# Patient Record
Sex: Male | Born: 1945 | Race: White | Hispanic: No | Marital: Married | Smoking: Former smoker
Health system: Southern US, Community
[De-identification: ages and names within clinical notes are randomized; demographics above are authoritative.]

## PROBLEM LIST (undated history)

## (undated) DIAGNOSIS — N400 Enlarged prostate without lower urinary tract symptoms: Secondary | ICD-10-CM

## (undated) DIAGNOSIS — M1990 Unspecified osteoarthritis, unspecified site: Secondary | ICD-10-CM

## (undated) DIAGNOSIS — C4491 Basal cell carcinoma of skin, unspecified: Secondary | ICD-10-CM

## (undated) DIAGNOSIS — S82892A Other fracture of left lower leg, initial encounter for closed fracture: Secondary | ICD-10-CM

## (undated) DIAGNOSIS — E039 Hypothyroidism, unspecified: Secondary | ICD-10-CM

## (undated) DIAGNOSIS — G4730 Sleep apnea, unspecified: Secondary | ICD-10-CM

## (undated) HISTORY — PX: BASAL CELL CARCINOMA EXCISION: SHX1214

## (undated) HISTORY — PX: APPENDECTOMY: SHX54

## (undated) HISTORY — PX: ANKLE CLOSED REDUCTION: SHX880

## (undated) HISTORY — PX: COLONOSCOPY: SHX174

---

## 1996-09-26 HISTORY — PX: CARDIAC CATHETERIZATION: SHX172

## 2004-11-19 ENCOUNTER — Ambulatory Visit: Payer: Self-pay | Admitting: Internal Medicine

## 2005-01-14 ENCOUNTER — Ambulatory Visit: Payer: Self-pay | Admitting: Internal Medicine

## 2005-01-20 ENCOUNTER — Ambulatory Visit: Payer: Self-pay | Admitting: Internal Medicine

## 2005-11-21 ENCOUNTER — Ambulatory Visit: Payer: Self-pay | Admitting: Internal Medicine

## 2006-02-14 ENCOUNTER — Ambulatory Visit: Payer: Self-pay | Admitting: Internal Medicine

## 2006-02-21 ENCOUNTER — Ambulatory Visit: Payer: Self-pay | Admitting: Internal Medicine

## 2008-06-23 ENCOUNTER — Ambulatory Visit: Payer: Self-pay | Admitting: Internal Medicine

## 2008-06-23 DIAGNOSIS — H9209 Otalgia, unspecified ear: Secondary | ICD-10-CM | POA: Insufficient documentation

## 2008-07-11 ENCOUNTER — Ambulatory Visit: Payer: Self-pay | Admitting: Internal Medicine

## 2008-07-11 LAB — CONVERTED CEMR LAB
ALT: 14 units/L (ref 0–53)
AST: 17 units/L (ref 0–37)
Albumin: 3.9 g/dL (ref 3.5–5.2)
Alkaline Phosphatase: 99 units/L (ref 39–117)
BUN: 10 mg/dL (ref 6–23)
Basophils Absolute: 0 10*3/uL (ref 0.0–0.1)
Basophils Relative: 0.7 % (ref 0.0–3.0)
Bilirubin, Direct: 0.1 mg/dL (ref 0.0–0.3)
CO2: 31 meq/L (ref 19–32)
Calcium: 9.1 mg/dL (ref 8.4–10.5)
Chloride: 108 meq/L (ref 96–112)
Cholesterol: 166 mg/dL (ref 0–200)
Creatinine, Ser: 0.9 mg/dL (ref 0.4–1.5)
Eosinophils Absolute: 0.1 10*3/uL (ref 0.0–0.7)
Eosinophils Relative: 2.3 % (ref 0.0–5.0)
GFR calc Af Amer: 110 mL/min
GFR calc non Af Amer: 91 mL/min
Glucose, Bld: 95 mg/dL (ref 70–99)
HCT: 42.9 % (ref 39.0–52.0)
HDL: 29.2 mg/dL — ABNORMAL LOW (ref >39.0)
Hemoglobin: 15.2 g/dL (ref 13.0–17.0)
LDL Cholesterol: 114 mg/dL — ABNORMAL HIGH (ref 0–99)
Lymphocytes Relative: 27.2 % (ref 12.0–46.0)
MCHC: 35.3 g/dL (ref 30.0–36.0)
MCV: 87.2 fL (ref 78.0–100.0)
Monocytes Absolute: 0.4 10*3/uL (ref 0.1–1.0)
Monocytes Relative: 9.8 % (ref 3.0–12.0)
Neutro Abs: 2.8 10*3/uL (ref 1.4–7.7)
Neutrophils Relative %: 60 % (ref 43.0–77.0)
Nitrite: NEGATIVE
PSA: 2.27 ng/mL (ref 0.10–4.00)
Platelets: 149 10*3/uL — ABNORMAL LOW (ref 150–400)
Potassium: 4 meq/L (ref 3.5–5.1)
RBC: 4.92 M/uL (ref 4.22–5.81)
RDW: 13.2 % (ref 11.5–14.6)
Sodium: 144 meq/L (ref 135–145)
Specific Gravity, Urine: 1.02
TSH: 5.31 microintl units/mL (ref 0.35–5.50)
Total Bilirubin: 0.7 mg/dL (ref 0.3–1.2)
Total CHOL/HDL Ratio: 5.7
Total Protein: 7.1 g/dL (ref 6.0–8.3)
Triglycerides: 114 mg/dL (ref 0–149)
VLDL: 23 mg/dL (ref 0–40)
WBC Urine, dipstick: NEGATIVE
WBC: 4.5 10*3/uL (ref 4.5–10.5)

## 2008-07-25 ENCOUNTER — Ambulatory Visit: Payer: Self-pay | Admitting: Internal Medicine

## 2008-07-25 DIAGNOSIS — E785 Hyperlipidemia, unspecified: Secondary | ICD-10-CM

## 2009-12-11 ENCOUNTER — Ambulatory Visit: Payer: Self-pay | Admitting: Internal Medicine

## 2009-12-11 DIAGNOSIS — J019 Acute sinusitis, unspecified: Secondary | ICD-10-CM

## 2009-12-25 ENCOUNTER — Ambulatory Visit: Payer: Self-pay | Admitting: Internal Medicine

## 2009-12-25 LAB — CONVERTED CEMR LAB
Albumin: 3.9 g/dL (ref 3.5–5.2)
Alkaline Phosphatase: 111 units/L (ref 39–117)
BUN: 7 mg/dL (ref 6–23)
Basophils Absolute: 0 10*3/uL (ref 0.0–0.1)
CO2: 28 meq/L (ref 19–32)
Calcium: 8.9 mg/dL (ref 8.4–10.5)
Chloride: 105 meq/L (ref 96–112)
Creatinine, Ser: 1 mg/dL (ref 0.4–1.5)
Eosinophils Absolute: 0.1 10*3/uL (ref 0.0–0.7)
GFR calc non Af Amer: 79.88 mL/min (ref >60)
Glucose, Urine, Semiquant: NEGATIVE
Hemoglobin: 14.9 g/dL (ref 13.0–17.0)
LDL Cholesterol: 106 mg/dL — ABNORMAL HIGH (ref 0–99)
Lymphocytes Relative: 25.6 % (ref 12.0–46.0)
MCHC: 34.7 g/dL (ref 30.0–36.0)
Neutro Abs: 3.5 10*3/uL (ref 1.4–7.7)
Nitrite: NEGATIVE
Platelets: 187 10*3/uL (ref 150.0–400.0)
Protein, U semiquant: NEGATIVE
RDW: 13.9 % (ref 11.5–14.6)
Sodium: 138 meq/L (ref 135–145)
TSH: 6.94 microintl units/mL — ABNORMAL HIGH (ref 0.35–5.50)
Total Bilirubin: 0.5 mg/dL (ref 0.3–1.2)
Urobilinogen, UA: 0.2
VLDL: 34 mg/dL (ref 0.0–40.0)

## 2010-01-04 ENCOUNTER — Ambulatory Visit: Payer: Self-pay | Admitting: Internal Medicine

## 2010-07-07 ENCOUNTER — Ambulatory Visit: Payer: Self-pay | Admitting: Internal Medicine

## 2010-07-07 DIAGNOSIS — M79609 Pain in unspecified limb: Secondary | ICD-10-CM | POA: Insufficient documentation

## 2010-07-09 ENCOUNTER — Ambulatory Visit: Payer: Self-pay | Admitting: Internal Medicine

## 2010-07-12 ENCOUNTER — Ambulatory Visit: Payer: Self-pay | Admitting: Internal Medicine

## 2010-07-14 ENCOUNTER — Encounter: Payer: Self-pay | Admitting: Internal Medicine

## 2010-07-14 ENCOUNTER — Telehealth: Payer: Self-pay | Admitting: Internal Medicine

## 2010-07-19 ENCOUNTER — Telehealth: Payer: Self-pay | Admitting: Internal Medicine

## 2010-10-26 NOTE — Assessment & Plan Note (Signed)
Summary: cpx/njr   Vital Signs:  Patient profile:   65 year old male Height:      68 inches Weight:      207 pounds BMI:     31.59 Temp:     98.2 degrees F oral BP sitting:   138 / 80  (left arm) Cuff size:   regular  Vitals Entered By: Duard Brady LPN (January 04, 2010 2:37 PM) CC: cpx - doing well - labs done Is Patient Diabetic? No   CC:  cpx - doing well - labs done.  History of Present Illness: a 65 year old patient who is seen today for a health maintenance examination.  He has a history of mild dyslipidemia with a low HDL cholesterol.  He has done quite well without any other concerns or complaints.  He was seen approximately 1 month ago for a sinus condition which has resolved.  Preventive Screening-Counseling & Management  Alcohol-Tobacco     Smoking Status: never  Allergies: 1)  ! Sulf-10  Past History:  Past Medical History: Reviewed history from 07/25/2008 and no changes required. Hyperlipidemia- low HDL cholesterol  Past Surgical History: Reviewed history from 07/25/2008 and no changes required. Appendectomy  age 46 heart catheterization, 1998 colonoscopy January 2007  Family History: Reviewed history from 06/23/2008 and no changes required. father died age 42, senile dementia mother died age 25.  GI malignancy, unclear type  History of uncles with prostate and bladder cancer  Two brothers, one with history of prostate cancer, age 18 one sister in good health  Social History: Reviewed history from 07/25/2008 and no changes required. Married Smoking Status:  never  Review of Systems  The patient denies anorexia, fever, weight loss, weight gain, vision loss, decreased hearing, hoarseness, chest pain, syncope, dyspnea on exertion, peripheral edema, prolonged cough, headaches, hemoptysis, abdominal pain, melena, hematochezia, severe indigestion/heartburn, hematuria, incontinence, genital sores, muscle weakness, suspicious skin lesions,  transient blindness, difficulty walking, depression, unusual weight change, abnormal bleeding, enlarged lymph nodes, angioedema, breast masses, and testicular masses.    Physical Exam  General:  mildly overweight.  Blood pressure 130/80 Head:  Normocephalic and atraumatic without obvious abnormalities. No apparent alopecia or balding. Eyes:  No corneal or conjunctival inflammation noted. EOMI. Perrla. Funduscopic exam benign, without hemorrhages, exudates or papilledema. Vision grossly normal. Ears:  External ear exam shows no significant lesions or deformities.  Otoscopic examination reveals clear canals, tympanic membranes are intact bilaterally without bulging, retraction, inflammation or discharge. Hearing is grossly normal bilaterally. Nose:  External nasal examination shows no deformity or inflammation. Nasal mucosa are pink and moist without lesions or exudates. Mouth:  Oral mucosa and oropharynx without lesions or exudates.  Teeth in good repair. Neck:  No deformities, masses, or tenderness noted. Chest Wall:  No deformities, masses, tenderness or gynecomastia noted. Breasts:  No masses or gynecomastia noted Lungs:  Normal respiratory effort, chest expands symmetrically. Lungs are clear to auscultation, no crackles or wheezes. Heart:  Normal rate and regular rhythm. S1 and S2 normal without gallop, murmur, click, rub or other extra sounds. Abdomen:  Bowel sounds positive,abdomen soft and non-tender without masses, organomegaly or hernias noted. Rectal:  No external abnormalities noted. Normal sphincter tone. No rectal masses or tenderness. Genitalia:  Testes bilaterally descended without nodularity, tenderness or masses. No scrotal masses or lesions. No penis lesions or urethral discharge. Prostate:  1+ enlarged.  1+ enlarged.   Msk:  No deformity or scoliosis noted of thoracic or lumbar spine.   Pulses:  R  and L carotid,radial,femoral,dorsalis pedis and posterior tibial pulses are full  and equal bilaterally Extremities:  No clubbing, cyanosis, edema, or deformity noted with normal full range of motion of all joints.   Neurologic:  No cranial nerve deficits noted. Station and gait are normal. Plantar reflexes are down-going bilaterally. DTRs are symmetrical throughout. Sensory, motor and coordinative functions appear intact. Skin:  Intact without suspicious lesions or rashes Cervical Nodes:  No lymphadenopathy noted Axillary Nodes:  No palpable lymphadenopathy Inguinal Nodes:  No significant adenopathy Psych:  Cognition and judgment appear intact. Alert and cooperative with normal attention span and concentration. No apparent delusions, illusions, hallucinations   Impression & Recommendations:  Problem # 1:  PREVENTIVE HEALTH CARE (ICD-V70.0)  Orders: EKG w/ Interpretation (93000)  Complete Medication List: 1)  Niaspan 1000 Mg Cr-tabs (Niacin (antihyperlipidemic)) .... One at bedtime  Patient Instructions: 1)  Please schedule a follow-up appointment in 1 year. 2)  It is important that you exercise regularly at least 20 minutes 5 times a week. If you develop chest pain, have severe difficulty breathing, or feel very tired , stop exercising immediately and seek medical attention. 3)  You need to lose weight. Consider a lower calorie diet and regular exercise.  4)  consider niacin to titrate to 2000 mg daily if tolerated

## 2010-10-26 NOTE — Progress Notes (Signed)
Summary: please advise of shingles  Phone Note Call from Patient Call back at [phone]   Caller: Patient---voice mail Summary of Call: Has shingles. was seen last Wednesday and was given the vaccination. Please return call Initial call taken by: Warnell Forester,  July 14, 2010 9:12 AM  Follow-up for Phone Call        Dr. Amador Cunas returned call , [facility]   KIK Follow-up by: Duard Brady LPN,  July 14, 2010 1:06 PM    New/Updated Medications: VALACYCLOVIR HCL 1 GM TABS (VALACYCLOVIR HCL) one  three times a day Prescriptions: VALACYCLOVIR HCL 1 GM TABS (VALACYCLOVIR HCL) one  three times a day  #21 x 0   Entered and Authorized by:   Gordy Savers  MD   Signed by:   Gordy Savers  MD on 07/14/2010   Method used:   [facility]        [facility] [address]       [address]       [place], [place]  [address]       Ph: [phone] or [phone]       Fax: [phone]   RxID:   [id number]

## 2010-10-26 NOTE — Assessment & Plan Note (Signed)
Summary: sinuses//ccm   Vital Signs:  Patient profile:   65 year old male Weight:      206 pounds Temp:     98.3 degrees F oral BP sitting:   120 / 74  (left arm) Cuff size:   regular  Vitals Entered By: Duard Brady LPN (December 11, 2009 1:40 PM) CC: c/o congestion - had amox refill and started that on sunday for what he says was a sinus infection with fever Is Patient Diabetic? No   CC:  c/o congestion - had amox refill and started that on sunday for what he says was a sinus infection with fever.  History of Present Illness: a 65 year old gentleman, who is seen today complaining of left maxillary sinus discomfort.  He had an old refill on amoxicillin that he has been for the past 5 days with improvement.He has a history of dyslipidemia low HDL.  He is off niacin therapy.  Otherwise, he has done well.  Preventive Screening-Counseling & Management  Alcohol-Tobacco     Smoking Status: quit  Allergies: 1)  ! Sulf-10  Past History:  Past Medical History: Reviewed history from 07/25/2008 and no changes required. Hyperlipidemia- low HDL cholesterol  Review of Systems       The patient complains of headaches.  The patient denies anorexia, fever, weight loss, weight gain, vision loss, decreased hearing, hoarseness, chest pain, syncope, dyspnea on exertion, peripheral edema, prolonged cough, hemoptysis, abdominal pain, melena, hematochezia, severe indigestion/heartburn, hematuria, incontinence, genital sores, muscle weakness, suspicious skin lesions, transient blindness, difficulty walking, depression, unusual weight change, abnormal bleeding, enlarged lymph nodes, angioedema, breast masses, and testicular masses.    Physical Exam  General:  Well-developed,well-nourished,in no acute distress; alert,appropriate and cooperative throughout examination Head:  Normocephalic and atraumatic without obvious abnormalities. No apparent alopecia or balding. mild discomfort in the left  maxillary sinus region Eyes:  No corneal or conjunctival inflammation noted. EOMI. Perrla. Funduscopic exam benign, without hemorrhages, exudates or papilledema. Vision grossly normal. Ears:  External ear exam shows no significant lesions or deformities.  Otoscopic examination reveals clear canals, tympanic membranes are intact bilaterally without bulging, retraction, inflammation or discharge. Hearing is grossly normal bilaterally. Nose:  External nasal examination shows no deformity or inflammation. Nasal mucosa are pink and moist without lesions or exudates. Mouth:  Oral mucosa and oropharynx without lesions or exudates.  Teeth in good repair. Neck:  No deformities, masses, or tenderness noted. Breasts:  No masses or gynecomastia noted Lungs:  Normal respiratory effort, chest expands symmetrically. Lungs are clear to auscultation, no crackles or wheezes. Heart:  Normal rate and regular rhythm. S1 and S2 normal without gallop, murmur, click, rub or other extra sounds. Abdomen:  Bowel sounds positive,abdomen soft and non-tender without masses, organomegaly or hernias noted. Msk:  No deformity or scoliosis noted of thoracic or lumbar spine.   Pulses:  R and L carotid,radial,femoral,dorsalis pedis and posterior tibial pulses are full and equal bilaterally   Impression & Recommendations:  Problem # 1:  SINUSITIS- ACUTE-NOS (ICD-461.9)  The following medications were removed from the medication list:    Amoxicillin 500 Mg Caps (Amoxicillin) ..... One tablet 3 times daily His updated medication list for this problem includes:    Amoxicillin-pot Clavulanate 875-125 Mg Tabs (Amoxicillin-pot clavulanate) ..... One twice daily  The following medications were removed from the medication list:    Amoxicillin 500 Mg Caps (Amoxicillin) ..... One tablet 3 times daily His updated medication list for this problem includes:    Amoxicillin-pot Clavulanate  875-125 Mg Tabs (Amoxicillin-pot clavulanate) .....  One twice daily  Problem # 2:  HYPERLIPIDEMIA (ICD-272.4)  His updated medication list for this problem includes:    Niaspan 1000 Mg Cr-tabs (Niacin (antihyperlipidemic)) ..... One at bedtime  His updated medication list for this problem includes:    Niaspan 1000 Mg Cr-tabs (Niacin (antihyperlipidemic)) ..... One at bedtime  Complete Medication List: 1)  Niaspan 1000 Mg Cr-tabs (Niacin (antihyperlipidemic)) .... One at bedtime 2)  Amoxicillin-pot Clavulanate 875-125 Mg Tabs (Amoxicillin-pot clavulanate) .... One twice daily  Patient Instructions: 1)  Please schedule a follow-up appointment in 6 months. 2)  It is important that you exercise regularly at least 20 minutes 5 times a week. If you develop chest pain, have severe difficulty breathing, or feel very tired , stop exercising immediately and seek medical attention. 3)  You need to lose weight. Consider a lower calorie diet and regular exercise.  Prescriptions: AMOXICILLIN-POT CLAVULANATE 875-125 MG TABS (AMOXICILLIN-POT CLAVULANATE) one twice daily  #20 x 0   Entered and Authorized by:   Gordy Savers  MD   Signed by:   Gordy Savers  MD on 12/11/2009   Method used:   Print then Give to Patient   RxID:   [id number]

## 2010-10-26 NOTE — Assessment & Plan Note (Signed)
Summary: left leg pain/njr   Vital Signs:  Patient profile:   65 year old male Weight:      211 pounds Temp:     97.6 degrees F oral BP sitting:   130 / 80  (left arm) Cuff size:   regular  Vitals Entered By: Duard Brady LPN (July 07, 2010 10:13 AM) CC: c/o (L) leg pain , no fall no injury - had hip pain this wkend Is Patient Diabetic? No   CC:  c/o (L) leg pain  and no fall no injury - had hip pain this wkend.  History of Present Illness: a 65 year old patient who presents with left leg pain for the past few days.  He did this considerable activities over the weekend, including climbing ladders.  He has pain in the left buttock anterior thigh and calf region.  He also has some discomfort in the left hip region.  He is somewhat concerned about a DVT due to a  brother with a history of a pulmonary emboli.  he has a history of dyslipidemia, treated with niacin therapy  Allergies: 1)  ! Sulf-10  Past History:  Past Medical History: Reviewed history from 07/25/2008 and no changes required. Hyperlipidemia- low HDL cholesterol  Review of Systems  The patient denies anorexia, fever, weight loss, weight gain, vision loss, decreased hearing, hoarseness, chest pain, syncope, dyspnea on exertion, peripheral edema, prolonged cough, headaches, hemoptysis, abdominal pain, hematochezia, severe indigestion/heartburn, hematuria, incontinence, genital sores, muscle weakness, suspicious skin lesions, transient blindness, difficulty walking, depression, unusual weight change, abnormal bleeding, enlarged lymph nodes, angioedema, breast masses, and testicular masses.    Physical Exam  General:  Well-developed,well-nourished,in no acute distress; alert,appropriate and cooperative throughout examination Msk:  full range and of motion of the hip; slight left calf tenderness, but no swelling, slight tenderness over the anterior thigh   Impression & Recommendations:  Problem # 1:  LEG  PAIN, LEFT (ICD-729.5)  Orders: Venipuncture (91478) T-D-Dimer Fibrin Derivatives Quantitive (29562-13086) Specimen Handling (57846)  Problem # 2:  HYPERLIPIDEMIA (ICD-272.4)  His updated medication list for this problem includes:    Niaspan 1000 Mg Cr-tabs (Niacin (antihyperlipidemic)) ..... One at bedtime  Complete Medication List: 1)  Niaspan 1000 Mg Cr-tabs (Niacin (antihyperlipidemic)) .... One at bedtime  Other Orders: Zoster (Shingles) Vaccine Live [phone]) Admin 1st Vaccine (28413)  Patient Instructions: 1)  Please schedule a follow-up appointment as needed. 2)  Take 400-600mg  of Ibuprofen (Advil, Motrin) with food every 4-6 hours as needed for relief of pain or comfort of fever.   Immunization History:  Influenza Immunization History:    Influenza:  Historical (06/10/2010)  Zostavax History:    Zostavax # 1:  Zostavax (07/07/2010)  Immunizations Administered:  Zostavax # 1:    Vaccine Type: Zostavax    Site: right deltoid    Mfr: Merck    Dose: 0.5 ml    Route: [place]    Given by: Duard Brady LPN    Exp. Date: 05/14/2011    Lot #: [id number]    VIS given: 07/08/05 given July 07, 2010.    Physician counseled: yes recvd at work per pt   KIK

## 2010-10-26 NOTE — Progress Notes (Signed)
Summary: refill shingles med   Phone Note Call from Patient Call back at [phone]   Caller: vm Summary of Call: Antiviral drug Wed.  Does he need to see me & should it be refilled?   Antiviral drug has helped.  Shingle sores still present, some are healing, one base of spine still irritated.  Should antiviral be continued after Wed.   What is your plan of care?  Should I come back in?   Am I progressing satisfactorily?  Appointment declined.  [facility]       Initial call taken by: Rudy Jew, RN,  July 19, 2010 9:52 AM  Follow-up for Phone Call        no benefit to taking additional antiviral therapy for shingles Follow-up by: Gordy Savers  MD,  July 19, 2010 11:15 AM  Additional Follow-up for Phone Call Additional follow up Details #1::        Phone Call Completed Additional Follow-up by: Rudy Jew, RN,  July 19, 2010 11:24 AM

## 2010-10-26 NOTE — Assessment & Plan Note (Signed)
Summary: fup on sciatic nerve/cjr   Vital Signs:  Patient profile:   65 year old male Temp:     99.1 degrees F oral BP sitting:   130 / 80  (right arm) Cuff size:   regular  Vitals Entered By: Duard Brady LPN (July 12, 2010 1:22 PM) vCC: still with c/o (L) low back pain, "topical"pain to thigh area , not sleeping Is Patient Diabetic? No   CC:  still with c/o (L) low back pain, "topical"pain to thigh area , and not sleeping.  History of Present Illness: 8 -year-old patient who is seen today for follow-up.  He does have some mild low back pain, which is associated with the discomfort in the left thigh region.  There has been no motor weakness; he has had  some difficulty with sleep due to the heightened pain.  Sensation, most marked in the left medial thigh.  Allergies: 1)  ! Sulf-10  Past History:  Past Medical History: Reviewed history from 07/25/2008 and no changes required. Hyperlipidemia- low HDL cholesterol  Physical Exam  General:  Well-developed,well-nourished,in no acute distress; alert,appropriate and cooperative throughout examination Msk:  negative straight leg test range of motion of the hip intact motor strength normal able walk on toes and heels that difficulty patellar and Achilles reflexes intact   Impression & Recommendations:  Problem # 1:  LEG PAIN, LEFT (ICD-729.5) patient probably has a fairly mild radiculopathy.  He will continue on rest, anti-inflammatories.  He will call if unimproved and an MRI will be considered.  If there is clinical worsening intractable pain or if he develops any motor weakness  Complete Medication List: 1)  Niaspan 1000 Mg Cr-tabs (Niacin (antihyperlipidemic)) .... One at bedtime  Patient Instructions: 1)  Take 400-600mg  of Ibuprofen (Advil, Motrin) with food every 4-6 hours as needed for relief of pain or comfort of fever. 2)  call if any clinical worsening   Orders Added: 1)  Est. Patient Level III  [91478]

## 2010-10-26 NOTE — Miscellaneous (Signed)
Summary: Waiver of Liability for Zostavax  Waiver of Liability for Zostavax   Imported By: Maryln Gottron 07/08/2010 13:37:27  _____________________________________________________________________  External Attachment:    Type:   Image     Comment:   External Document

## 2010-10-26 NOTE — Assessment & Plan Note (Signed)
Summary: L HIP PAIN // RS   Vital Signs:  Patient profile:   65 year old male Weight:      211 pounds Temp:     98.5 degrees F oral BP sitting:   130 / 70  (right arm) Cuff size:   regular  Vitals Entered By: Duard Brady LPN (July 09, 2010 9:03 AM) CC: pain moving - (R) mid back , calf , knee    pain med not really helping Is Patient Diabetic? No   CC:  pain moving - (R) mid back , calf , and knee    pain med not really helping.  History of Present Illness: a 65 year old patient has had discomfort in the left lower back left buttock and left leg intermittently since heavy manual activity is over last weekend.  He was seen here two days ago concerned about a possible DVT since a brother had a pulmonary embolism.  A d-dimer was negative and his clinical exam was unremarkable.  He continues to have mild intermittent discomfort that waxed and wanes in the left lower back, buttock and thigh region.  He is using Aleve, but at a dose of only one twice daily  Allergies: 1)  ! Sulf-10  Past History:  Past Medical History: Reviewed history from 07/25/2008 and no changes required. Hyperlipidemia- low HDL cholesterol  Physical Exam  General:  Well-developed,well-nourished,in no acute distress; alert,appropriate and cooperative throughout examination Msk:  negative straight leg test full range of motion left hip no swelling or signs of active inflammation no tenderness in the left lumbar spine region or evidence of muscle spasm   Impression & Recommendations:  Problem # 1:  LEG PAIN, LEFT (ICD-729.5)  Complete Medication List: 1)  Niaspan 1000 Mg Cr-tabs (Niacin (antihyperlipidemic)) .... One at bedtime  Patient Instructions: 1)  Take 650-1000mg  of Tylenol every 4-6 hours as needed for relief of pain or comfort of fever AVOID taking more than 4000mg   in a 24 hour period (can cause liver damage in higher doses). 2)  Take 400-600mg  of Ibuprofen (Advil, Motrin) with food  every 4-6 hours as needed for relief of pain or comfort of fever.

## 2011-01-31 ENCOUNTER — Encounter: Payer: Self-pay | Admitting: Internal Medicine

## 2011-04-05 ENCOUNTER — Telehealth: Payer: Self-pay

## 2011-04-05 NOTE — Telephone Encounter (Signed)
Called pt to discuss labs recv'd from [facility] - ordered by Dr. Kallie Locks 04/04/11 - Mr.Mcvey states Dr. Kallie Locks is his PCP now - not Dr. Amador Cunas and that he will f/u on labs with him.  Dr. Amador Cunas made aware.  To suandrea to remove dr. Amador Cunas as pcp.

## 2013-09-26 HISTORY — PX: SHOULDER OPEN ROTATOR CUFF REPAIR: SHX2407

## 2015-06-17 ENCOUNTER — Inpatient Hospital Stay (HOSPITAL_COMMUNITY)
Admission: AD | Admit: 2015-06-17 | Discharge: 2015-06-19 | DRG: 493 | Disposition: A | Discharge status: Home Health | Payer: Medicare Other | Source: Ambulatory Visit | Attending: Orthopedic Surgery | Admitting: Orthopedic Surgery

## 2015-06-17 ENCOUNTER — Encounter (HOSPITAL_COMMUNITY): Payer: Self-pay | Admitting: Orthopedic Surgery

## 2015-06-17 ENCOUNTER — Inpatient Hospital Stay (HOSPITAL_COMMUNITY): Payer: Medicare Other

## 2015-06-17 DIAGNOSIS — Z87891 Personal history of nicotine dependence: Secondary | ICD-10-CM | POA: Diagnosis not present

## 2015-06-17 DIAGNOSIS — E039 Hypothyroidism, unspecified: Secondary | ICD-10-CM | POA: Diagnosis present

## 2015-06-17 DIAGNOSIS — S82899A Other fracture of unspecified lower leg, initial encounter for closed fracture: Secondary | ICD-10-CM

## 2015-06-17 DIAGNOSIS — S82852A Displaced trimalleolar fracture of left lower leg, initial encounter for closed fracture: Secondary | ICD-10-CM | POA: Diagnosis present

## 2015-06-17 DIAGNOSIS — L03116 Cellulitis of left lower limb: Secondary | ICD-10-CM | POA: Diagnosis present

## 2015-06-17 DIAGNOSIS — S82892A Other fracture of left lower leg, initial encounter for closed fracture: Secondary | ICD-10-CM

## 2015-06-17 DIAGNOSIS — S9305XA Dislocation of left ankle joint, initial encounter: Principal | ICD-10-CM | POA: Diagnosis present

## 2015-06-17 DIAGNOSIS — T148XXA Other injury of unspecified body region, initial encounter: Secondary | ICD-10-CM

## 2015-06-17 DIAGNOSIS — Z882 Allergy status to sulfonamides status: Secondary | ICD-10-CM

## 2015-06-17 DIAGNOSIS — Z01811 Encounter for preprocedural respiratory examination: Secondary | ICD-10-CM

## 2015-06-17 HISTORY — DX: Basal cell carcinoma of skin, unspecified: C44.91

## 2015-06-17 HISTORY — DX: Other fracture of left lower leg, initial encounter for closed fracture: S82.892A

## 2015-06-17 HISTORY — DX: Sleep apnea, unspecified: G47.30

## 2015-06-17 HISTORY — DX: Hypothyroidism, unspecified: E03.9

## 2015-06-17 HISTORY — DX: Benign prostatic hyperplasia without lower urinary tract symptoms: N40.0

## 2015-06-17 MED ORDER — SENNOSIDES-DOCUSATE SODIUM 8.6-50 MG PO TABS: 1.0000 | ORAL_TABLET | Freq: Every evening | ORAL | Status: DC | PRN

## 2015-06-17 MED ORDER — LACTATED RINGERS IV SOLN
INTRAVENOUS | Status: DC
  Administered 2015-06-17: 18:00:00 via INTRAVENOUS

## 2015-06-17 MED ORDER — INFLUENZA VAC SPLIT QUAD 0.5 ML IM SUSY
0.5000 mL | PREFILLED_SYRINGE | INTRAMUSCULAR | Status: AC
  Administered 2015-06-18: 0.5 mL via INTRAMUSCULAR
  Filled 2015-06-17: qty 0.5

## 2015-06-17 MED ORDER — CEFAZOLIN SODIUM 1-5 GM-% IV SOLN
1.0000 g | Freq: Three times a day (TID) | INTRAVENOUS | Status: DC
  Administered 2015-06-17 – 2015-06-19 (×6): 1 g via INTRAVENOUS
  Filled 2015-06-17 (×11): qty 50

## 2015-06-17 MED ORDER — HYDROCODONE-ACETAMINOPHEN 5-325 MG PO TABS
1.0000 | ORAL_TABLET | Freq: Four times a day (QID) | ORAL | Status: DC | PRN
  Administered 2015-06-17 – 2015-06-18 (×3): 2 via ORAL
  Filled 2015-06-17 (×3): qty 2

## 2015-06-17 MED ORDER — PROMETHAZINE HCL 25 MG PO TABS: 12.5000 mg | ORAL_TABLET | Freq: Four times a day (QID) | ORAL | Status: DC | PRN

## 2015-06-17 MED ORDER — ALUM & MAG HYDROXIDE-SIMETH 200-200-20 MG/5ML PO SUSP: 30.0000 mL | Freq: Four times a day (QID) | ORAL | Status: DC | PRN

## 2015-06-17 MED ORDER — DOCUSATE SODIUM 100 MG PO CAPS
100.0000 mg | ORAL_CAPSULE | Freq: Two times a day (BID) | ORAL | Status: DC
  Administered 2015-06-17 – 2015-06-18 (×2): 100 mg via ORAL
  Filled 2015-06-17 (×2): qty 1

## 2015-06-17 MED ORDER — OXYCODONE HCL 5 MG PO TABS: 5.0000 mg | ORAL_TABLET | ORAL | Status: DC | PRN

## 2015-06-17 MED ORDER — HYDROMORPHONE HCL 1 MG/ML IJ SOLN: 0.5000 mg | INTRAMUSCULAR | Status: DC | PRN

## 2015-06-17 MED ORDER — ACETAMINOPHEN 650 MG RE SUPP: 650.0000 mg | Freq: Four times a day (QID) | RECTAL | Status: DC | PRN

## 2015-06-17 MED ORDER — SORBITOL 70 % SOLN: 30.0000 mL | Freq: Every day | Status: DC | PRN

## 2015-06-17 MED ORDER — MAGNESIUM CITRATE PO SOLN: 1.0000 | Freq: Once | ORAL | Status: DC | PRN

## 2015-06-17 MED ORDER — ACETAMINOPHEN 325 MG PO TABS: 650.0000 mg | ORAL_TABLET | Freq: Four times a day (QID) | ORAL | Status: DC | PRN

## 2015-06-17 MED ORDER — METHOCARBAMOL 500 MG PO TABS: 500.0000 mg | ORAL_TABLET | Freq: Four times a day (QID) | ORAL | Status: DC | PRN

## 2015-06-17 NOTE — H&P (Signed)
[facility] H&P   Chief Complaint:  Left ankle fracture dislocation  HPI:   69 y/o male sustained a fracture-dislocation to his left ankle on 06/12/2015 [place] the [place]. Seen in ED and had reduction performed. Pt followed up with Dr. French Ana here in town on 9/19 and ankle was subluxed still. Pt was also noted to have extensive blistering to his ankle as well as some erythema. Pt had a new splint applied with re-reduction of his fracture. Due to the complexity of the injury he was referred to OTS for definitive management. Pt see in office on 9/21. Follow up xrays showed persistent lateral subluxation of his talus. His splint was split to eval soft tissues. He has quite extensive erythema and blistering, primarily laterally from what was seen.  Based on this we felt it necessary to admit the pt for IV abx and placement of ex fix in the morning   Past Medical History  Diagnosis Date  . 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 BPH (benign prostatic hyperplasia)     Past Surgical History  Procedure Laterality Date  . Rotator cuff repair Right 2015  . Appendectomy  1960s    No family history on file. Social History:  reports that he has quit smoking. He does not have any smokeless tobacco history on file. His alcohol and drug histories are not on file.  Allergies:  Allergies  Allergen Reactions  . Sulfacetamide Sodium     meds PTA  Keflex  Percocet  Levothyroxine   Labs pending   Review of Systems  Constitutional: Negative for fever and chills.  Respiratory: Negative for shortness of breath and wheezing.   Cardiovascular: Negative for chest pain and palpitations.  Gastrointestinal: Negative for nausea, vomiting and abdominal pain.  Musculoskeletal:       Left ankle pain   Neurological: Negative for tingling, sensory change and headaches.    Vitals on arrival to floor  Physical Exam  Constitutional: He is oriented to person, place, and  time. He appears well-developed. He is cooperative. He does not have a sickly appearance. He does not appear ill. No distress.  Cardiovascular: Normal rate, regular rhythm, S1 normal and S2 normal.   Respiratory: Effort normal and breath sounds normal. No accessory muscle usage. No respiratory distress. He has no decreased breath sounds. He has no wheezes. He has no rhonchi.  GI: Soft. Bowel sounds are normal. There is no tenderness.  Musculoskeletal:  Left Lower Extremity    Pt splinted   Partial split of the splint shows marked erythema around ankle and mid lower leg   Extensive fracture blistering noted as well    Ext warm    + DP pulse    DPN, SPN, TN sensation intact    EHL, FHL, lesser toe motor functions intact    Knee nontender to evaluation    No pain with passive stretching   Neurological: He is alert and oriented to person, place, and time.  Psychiatric: He has a normal mood and affect. His speech is normal and behavior is normal.     Xrays    L ankle (3 view0  Displaced medial mall fracture             Oblique fracture distal fibula  Lateral subluxation of talus              Syndesmosis appears intact   Assessment/Plan  69 y/o male with bimall ankle fracture dislocation  Admit today  to start IV abx OR tomorrow for application of spanning ex fix to address fracture and to allow for soft tissue management NWB L leg Hopeful that ex fix will allow for rapid resolution of swelling to proceed with ORIF of ankle in 10-14 days if needed Check xray of L knee NPO after MN   Jari Pigg, PA-C [facility] [phone] (P) 06/17/2015, 10:49 AM

## 2015-06-18 ENCOUNTER — Inpatient Hospital Stay (HOSPITAL_COMMUNITY): Payer: Medicare Other

## 2015-06-18 ENCOUNTER — Encounter (HOSPITAL_COMMUNITY)
Admission: AD | Disposition: A | Discharge status: Home Health | Payer: Self-pay | Source: Ambulatory Visit | Attending: Orthopedic Surgery

## 2015-06-18 ENCOUNTER — Inpatient Hospital Stay (HOSPITAL_COMMUNITY): Payer: Medicare Other | Admitting: Anesthesiology

## 2015-06-18 ENCOUNTER — Other Ambulatory Visit: Payer: Self-pay

## 2015-06-18 ENCOUNTER — Inpatient Hospital Stay (HOSPITAL_COMMUNITY): Admission: RE | Admit: 2015-06-18 | Payer: Medicare Other | Source: Ambulatory Visit | Admitting: Orthopedic Surgery

## 2015-06-18 DIAGNOSIS — S9305XA Dislocation of left ankle joint, initial encounter: Secondary | ICD-10-CM | POA: Diagnosis not present

## 2015-06-18 HISTORY — PX: ORIF ANKLE FRACTURE: SHX5408

## 2015-06-18 LAB — COMPREHENSIVE METABOLIC PANEL
ALBUMIN: 3.2 g/dL — AB (ref 3.5–5.0)
ALT: 19 U/L (ref 17–63)
ANION GAP: 7 (ref 5–15)
AST: 25 U/L (ref 15–41)
Alkaline Phosphatase: 80 U/L (ref 38–126)
BILIRUBIN TOTAL: 0.9 mg/dL (ref 0.3–1.2)
BUN: 9 mg/dL (ref 6–20)
CO2: 29 mmol/L (ref 22–32)
Calcium: 8.9 mg/dL (ref 8.9–10.3)
Chloride: 101 mmol/L (ref 101–111)
Creatinine, Ser: 1.05 mg/dL (ref 0.61–1.24)
GFR calc Af Amer: >60 mL/min (ref >60)
GFR calc non Af Amer: >60 mL/min (ref >60)
GLUCOSE: 102 mg/dL — AB (ref 65–99)
POTASSIUM: 3.7 mmol/L (ref 3.5–5.1)
SODIUM: 137 mmol/L (ref 135–145)
TOTAL PROTEIN: 6.6 g/dL (ref 6.5–8.1)

## 2015-06-18 LAB — CBC
HCT: 40.3 % (ref 39.0–52.0)
Hemoglobin: 14 g/dL (ref 13.0–17.0)
MCH: 29.2 pg (ref 26.0–34.0)
MCHC: 34.7 g/dL (ref 30.0–36.0)
MCV: 84 fL (ref 78.0–100.0)
PLATELETS: 200 10*3/uL (ref 150–400)
RBC: 4.8 MIL/uL (ref 4.22–5.81)
RDW: 13.8 % (ref 11.5–15.5)
WBC: 7 10*3/uL (ref 4.0–10.5)

## 2015-06-18 LAB — PROTIME-INR
INR: 1.02 (ref 0.00–1.49)
PROTHROMBIN TIME: 13.6 s (ref 11.6–15.2)

## 2015-06-18 LAB — SURGICAL PCR SCREEN
MRSA, PCR: NEGATIVE
Staphylococcus aureus: NEGATIVE

## 2015-06-18 LAB — APTT: APTT: 30 s (ref 24–37)

## 2015-06-18 SURGERY — OPEN REDUCTION INTERNAL FIXATION (ORIF) ANKLE FRACTURE
Anesthesia: General | Site: Ankle | Laterality: Left

## 2015-06-18 MED ORDER — BUPIVACAINE-EPINEPHRINE (PF) 0.5% -1:200000 IJ SOLN
INTRAMUSCULAR | Status: DC | PRN
  Administered 2015-06-18: 50 mL

## 2015-06-18 MED ORDER — LEVOTHYROXINE SODIUM 75 MCG PO TABS
75.0000 ug | ORAL_TABLET | Freq: Every day | ORAL | Status: DC
  Administered 2015-06-19: 75 ug via ORAL
  Filled 2015-06-18: qty 1

## 2015-06-18 MED ORDER — SENNOSIDES-DOCUSATE SODIUM 8.6-50 MG PO TABS
1.0000 | ORAL_TABLET | Freq: Every evening | ORAL | Status: DC | PRN
Start: 2015-06-18 — End: 2015-06-19

## 2015-06-18 MED ORDER — PROMETHAZINE HCL 25 MG/ML IJ SOLN: 6.2500 mg | INTRAMUSCULAR | Status: DC | PRN

## 2015-06-18 MED ORDER — DOCUSATE SODIUM 100 MG PO CAPS
100.0000 mg | ORAL_CAPSULE | Freq: Two times a day (BID) | ORAL | Status: DC
  Administered 2015-06-18 – 2015-06-19 (×3): 100 mg via ORAL
  Filled 2015-06-18 (×3): qty 1

## 2015-06-18 MED ORDER — PROPOFOL 10 MG/ML IV BOLUS
INTRAVENOUS | Status: DC | PRN
  Administered 2015-06-18: 160 mg via INTRAVENOUS

## 2015-06-18 MED ORDER — METOCLOPRAMIDE HCL 5 MG PO TABS: 5.0000 mg | ORAL_TABLET | Freq: Three times a day (TID) | ORAL | Status: DC | PRN

## 2015-06-18 MED ORDER — OXYCODONE-ACETAMINOPHEN 5-325 MG PO TABS: 1.0000 | ORAL_TABLET | Freq: Four times a day (QID) | ORAL | Status: DC | PRN

## 2015-06-18 MED ORDER — HYDROMORPHONE HCL 1 MG/ML IJ SOLN
0.5000 mg | INTRAMUSCULAR | Status: DC | PRN
  Administered 2015-06-18 – 2015-06-19 (×7): 0.5 mg via INTRAVENOUS
  Filled 2015-06-18 (×4): qty 1

## 2015-06-18 MED ORDER — METHOCARBAMOL 500 MG PO TABS: 500.0000 mg | ORAL_TABLET | Freq: Four times a day (QID) | ORAL | Status: DC | PRN

## 2015-06-18 MED ORDER — CEFAZOLIN SODIUM 1-5 GM-% IV SOLN
INTRAVENOUS | Status: DC | PRN
  Administered 2015-06-18: 1 g via INTRAVENOUS

## 2015-06-18 MED ORDER — FENTANYL CITRATE (PF) 100 MCG/2ML IJ SOLN
INTRAMUSCULAR | Status: AC
  Administered 2015-06-18: 50 ug via INTRAVENOUS
  Filled 2015-06-18: qty 2

## 2015-06-18 MED ORDER — ONDANSETRON HCL 4 MG/2ML IJ SOLN: 4.0000 mg | Freq: Four times a day (QID) | INTRAMUSCULAR | Status: DC | PRN

## 2015-06-18 MED ORDER — METHOCARBAMOL 1000 MG/10ML IJ SOLN
500.0000 mg | Freq: Four times a day (QID) | INTRAMUSCULAR | Status: DC | PRN
  Filled 2015-06-18: qty 5

## 2015-06-18 MED ORDER — MIDAZOLAM HCL 2 MG/2ML IJ SOLN
INTRAMUSCULAR | Status: AC
  Administered 2015-06-18: 2 mg via INTRAVENOUS
  Filled 2015-06-18: qty 2

## 2015-06-18 MED ORDER — VITAMIN D 50 MCG (2000 UT) PO CAPS: 2000.0000 [IU] | ORAL_CAPSULE | Freq: Every day | ORAL | Status: DC

## 2015-06-18 MED ORDER — HYDROMORPHONE HCL 1 MG/ML IJ SOLN: 0.2500 mg | INTRAMUSCULAR | Status: DC | PRN

## 2015-06-18 MED ORDER — ACETAMINOPHEN 650 MG RE SUPP: 650.0000 mg | Freq: Four times a day (QID) | RECTAL | Status: DC | PRN

## 2015-06-18 MED ORDER — METHOCARBAMOL 500 MG PO TABS
500.0000 mg | ORAL_TABLET | Freq: Four times a day (QID) | ORAL | Status: DC | PRN
  Administered 2015-06-18 – 2015-06-19 (×4): 500 mg via ORAL
  Filled 2015-06-18 (×3): qty 1

## 2015-06-18 MED ORDER — LACTATED RINGERS IV SOLN
INTRAVENOUS | Status: DC
  Administered 2015-06-18: 10:00:00 via INTRAVENOUS

## 2015-06-18 MED ORDER — POTASSIUM CHLORIDE IN NACL 20-0.9 MEQ/L-% IV SOLN
INTRAVENOUS | Status: DC
  Administered 2015-06-18: 16:00:00 via INTRAVENOUS
  Filled 2015-06-18: qty 1000

## 2015-06-18 MED ORDER — ONDANSETRON HCL 4 MG PO TABS: 4.0000 mg | ORAL_TABLET | Freq: Four times a day (QID) | ORAL | Status: DC | PRN

## 2015-06-18 MED ORDER — ENOXAPARIN SODIUM 40 MG/0.4ML ~~LOC~~ SOLN
40.0000 mg | SUBCUTANEOUS | Status: DC
  Administered 2015-06-19: 40 mg via SUBCUTANEOUS
  Filled 2015-06-18: qty 0.4

## 2015-06-18 MED ORDER — ADULT MULTIVITAMIN W/MINERALS CH
1.0000 | ORAL_TABLET | Freq: Every day | ORAL | Status: DC
  Administered 2015-06-19: 1 via ORAL
  Filled 2015-06-18: qty 1

## 2015-06-18 MED ORDER — LIDOCAINE HCL (CARDIAC) 20 MG/ML IV SOLN
INTRAVENOUS | Status: DC | PRN
  Administered 2015-06-18: 40 mg via INTRAVENOUS

## 2015-06-18 MED ORDER — OXYCODONE HCL 5 MG PO TABS: 5.0000 mg | ORAL_TABLET | Freq: Four times a day (QID) | ORAL | Status: DC | PRN

## 2015-06-18 MED ORDER — OXYCODONE-ACETAMINOPHEN 5-325 MG PO TABS
1.0000 | ORAL_TABLET | Freq: Four times a day (QID) | ORAL | Status: DC | PRN
  Administered 2015-06-18 – 2015-06-19 (×4): 2 via ORAL
  Filled 2015-06-18 (×3): qty 2

## 2015-06-18 MED ORDER — OXYCODONE HCL 5 MG PO TABS
5.0000 mg | ORAL_TABLET | ORAL | Status: DC | PRN
  Administered 2015-06-18: 10 mg via ORAL
  Administered 2015-06-18: 5 mg via ORAL
  Administered 2015-06-19 (×5): 10 mg via ORAL
  Filled 2015-06-18 (×6): qty 2

## 2015-06-18 MED ORDER — AMOXICILLIN-POT CLAVULANATE 875-125 MG PO TABS: 1.0000 | ORAL_TABLET | Freq: Two times a day (BID) | ORAL | Status: DC

## 2015-06-18 MED ORDER — MIDAZOLAM HCL 2 MG/2ML IJ SOLN
1.0000 mg | INTRAMUSCULAR | Status: DC | PRN
  Administered 2015-06-18: 2 mg via INTRAVENOUS

## 2015-06-18 MED ORDER — VITAMIN D 1000 UNITS PO TABS
2000.0000 [IU] | ORAL_TABLET | Freq: Every day | ORAL | Status: DC
  Administered 2015-06-18 – 2015-06-19 (×2): 2000 [IU] via ORAL
  Filled 2015-06-18 (×2): qty 2

## 2015-06-18 MED ORDER — FENTANYL CITRATE (PF) 100 MCG/2ML IJ SOLN
INTRAMUSCULAR | Status: DC | PRN
  Administered 2015-06-18 (×2): 25 ug via INTRAVENOUS

## 2015-06-18 MED ORDER — MAGNESIUM CITRATE PO SOLN: 1.0000 | Freq: Once | ORAL | Status: DC | PRN

## 2015-06-18 MED ORDER — MEPERIDINE HCL 25 MG/ML IJ SOLN: 6.2500 mg | INTRAMUSCULAR | Status: DC | PRN

## 2015-06-18 MED ORDER — CEFAZOLIN SODIUM 1-5 GM-% IV SOLN
1.0000 g | Freq: Once | INTRAVENOUS | Status: DC
  Filled 2015-06-18: qty 50

## 2015-06-18 MED ORDER — 0.9 % SODIUM CHLORIDE (POUR BTL) OPTIME
TOPICAL | Status: DC | PRN
  Administered 2015-06-18: 1000 mL

## 2015-06-18 MED ORDER — ONDANSETRON HCL 4 MG/2ML IJ SOLN
INTRAMUSCULAR | Status: DC | PRN
  Administered 2015-06-18: 4 mg via INTRAVENOUS

## 2015-06-18 MED ORDER — ACETAMINOPHEN 325 MG PO TABS: 650.0000 mg | ORAL_TABLET | Freq: Four times a day (QID) | ORAL | Status: DC | PRN

## 2015-06-18 MED ORDER — BISACODYL 5 MG PO TBEC: 5.0000 mg | DELAYED_RELEASE_TABLET | Freq: Every day | ORAL | Status: DC | PRN

## 2015-06-18 MED ORDER — FENTANYL CITRATE (PF) 100 MCG/2ML IJ SOLN
50.0000 ug | INTRAMUSCULAR | Status: DC | PRN
  Administered 2015-06-18: 50 ug via INTRAVENOUS

## 2015-06-18 MED ORDER — DOCUSATE SODIUM 100 MG PO CAPS: 100.0000 mg | ORAL_CAPSULE | Freq: Two times a day (BID) | ORAL | Status: DC

## 2015-06-18 MED ORDER — MIDAZOLAM HCL 5 MG/5ML IJ SOLN
INTRAMUSCULAR | Status: DC | PRN
  Administered 2015-06-18: 0.5 mg via INTRAVENOUS

## 2015-06-18 MED ORDER — ENOXAPARIN SODIUM 40 MG/0.4ML ~~LOC~~ SOLN: 40.0000 mg | SUBCUTANEOUS | Status: DC

## 2015-06-18 MED ORDER — METOCLOPRAMIDE HCL 5 MG/ML IJ SOLN
5.0000 mg | Freq: Three times a day (TID) | INTRAMUSCULAR | Status: DC | PRN
Start: 2015-06-18 — End: 2015-06-19

## 2015-06-18 MED ORDER — DIPHENHYDRAMINE HCL 12.5 MG/5ML PO ELIX: 12.5000 mg | ORAL_SOLUTION | ORAL | Status: DC | PRN

## 2015-06-18 SURGICAL SUPPLY — 70 items
BANDAGE ELASTIC 3 VELCRO ST LF (GAUZE/BANDAGES/DRESSINGS) ×3 IMPLANT
BANDAGE ELASTIC 4 VELCRO ST LF (GAUZE/BANDAGES/DRESSINGS) IMPLANT
BANDAGE ELASTIC 6 VELCRO ST LF (GAUZE/BANDAGES/DRESSINGS) IMPLANT
BANDAGE ESMARK 6X9 LF (GAUZE/BANDAGES/DRESSINGS) ×1 IMPLANT
BAR GLASS FIBER EXFX 11X400 (EXFIX) ×6 IMPLANT
BLADE SURG 10 STRL SS (BLADE) ×3 IMPLANT
BNDG COHESIVE 4X5 TAN STRL (GAUZE/BANDAGES/DRESSINGS) ×3 IMPLANT
BNDG ELASTIC 2 VLCR STRL LF (GAUZE/BANDAGES/DRESSINGS) ×3 IMPLANT
BNDG ESMARK 6X9 LF (GAUZE/BANDAGES/DRESSINGS) ×3
BNDG GAUZE ELAST 4 BULKY (GAUZE/BANDAGES/DRESSINGS) ×3 IMPLANT
BRUSH SCRUB DISP (MISCELLANEOUS) ×6 IMPLANT
CLAMP BLUE BAR TO BAR (MISCELLANEOUS) ×3 IMPLANT
CLAMP BLUE BAR TO PIN (MISCELLANEOUS) ×12 IMPLANT
COVER SURGICAL LIGHT HANDLE (MISCELLANEOUS) ×6 IMPLANT
DRAPE C-ARM 42X72 X-RAY (DRAPES) IMPLANT
DRAPE C-ARMOR (DRAPES) ×6 IMPLANT
DRAPE ORTHO SPLIT 77X108 STRL (DRAPES) ×6
DRAPE PROXIMA HALF (DRAPES) ×3 IMPLANT
DRAPE SURG ORHT 6 SPLT 77X108 (DRAPES) ×3 IMPLANT
DRAPE U-SHAPE 47X51 STRL (DRAPES) ×3 IMPLANT
DRSG EMULSION OIL 3X3 NADH (GAUZE/BANDAGES/DRESSINGS) IMPLANT
DRSG MEPITEL 4X7.2 (GAUZE/BANDAGES/DRESSINGS) ×3 IMPLANT
ELECT REM PT RETURN 9FT ADLT (ELECTROSURGICAL) ×3
ELECTRODE REM PT RTRN 9FT ADLT (ELECTROSURGICAL) ×1 IMPLANT
GAUZE SPONGE 4X4 12PLY STRL (GAUZE/BANDAGES/DRESSINGS) IMPLANT
GLOVE BIO SURGEON STRL SZ7.5 (GLOVE) ×3 IMPLANT
GLOVE BIO SURGEON STRL SZ8 (GLOVE) ×3 IMPLANT
GLOVE BIOGEL PI IND STRL 7.5 (GLOVE) ×1 IMPLANT
GLOVE BIOGEL PI IND STRL 8 (GLOVE) ×1 IMPLANT
GLOVE BIOGEL PI INDICATOR 7.5 (GLOVE) ×2
GLOVE BIOGEL PI INDICATOR 8 (GLOVE) ×2
GOWN STRL REUS W/ TWL LRG LVL3 (GOWN DISPOSABLE) ×2 IMPLANT
GOWN STRL REUS W/ TWL XL LVL3 (GOWN DISPOSABLE) ×1 IMPLANT
GOWN STRL REUS W/TWL LRG LVL3 (GOWN DISPOSABLE) ×4
GOWN STRL REUS W/TWL XL LVL3 (GOWN DISPOSABLE) ×2
HALF PIN 3MM (PIN) ×3 IMPLANT
KIT BASIN OR (CUSTOM PROCEDURE TRAY) ×3 IMPLANT
KIT ROOM TURNOVER OR (KITS) ×3 IMPLANT
MANIFOLD NEPTUNE II (INSTRUMENTS) ×3 IMPLANT
NEEDLE HYPO 21X1.5 SAFETY (NEEDLE) IMPLANT
NS IRRIG 1000ML POUR BTL (IV SOLUTION) ×3 IMPLANT
PACK GENERAL/GYN (CUSTOM PROCEDURE TRAY) ×3 IMPLANT
PAD ARMBOARD 7.5X6 YLW CONV (MISCELLANEOUS) ×6 IMPLANT
PAD CAST 4YDX4 CTTN HI CHSV (CAST SUPPLIES) IMPLANT
PADDING CAST COTTON 4X4 STRL (CAST SUPPLIES)
PADDING CAST COTTON 6X4 STRL (CAST SUPPLIES) IMPLANT
PENCIL BUTTON HOLSTER BLD 10FT (ELECTRODE) ×3 IMPLANT
PIN 3MM (PIN) ×3 IMPLANT
PIN CLAMP 2BAR 75MM BLUE (PIN) ×3 IMPLANT
PIN HALF YELLOW 5X160X35 (PIN) ×6 IMPLANT
PIN TRANSFIXING 5.0 (PIN) ×6 IMPLANT
SPONGE GAUZE 4X4 12PLY STER LF (GAUZE/BANDAGES/DRESSINGS) ×3 IMPLANT
SPONGE LAP 18X18 X RAY DECT (DISPOSABLE) ×9 IMPLANT
SPONGE SCRUB IODOPHOR (GAUZE/BANDAGES/DRESSINGS) ×3 IMPLANT
STAPLER VISISTAT 35W (STAPLE) IMPLANT
SUCTION FRAZIER TIP 10 FR DISP (SUCTIONS) ×3 IMPLANT
SUT ETHILON 2 0 FS 18 (SUTURE) IMPLANT
SUT ETHILON 3 0 PS 1 (SUTURE) IMPLANT
SUT PDS AB 2-0 CT1 27 (SUTURE) IMPLANT
SUT VIC AB 2-0 CT1 27 (SUTURE)
SUT VIC AB 2-0 CT1 TAPERPNT 27 (SUTURE) IMPLANT
SUT VIC AB 2-0 CT3 27 (SUTURE) IMPLANT
SYR CONTROL 10ML LL (SYRINGE) IMPLANT
TOWEL OR 17X24 6PK STRL BLUE (TOWEL DISPOSABLE) ×3 IMPLANT
TOWEL OR 17X26 10 PK STRL BLUE (TOWEL DISPOSABLE) ×6 IMPLANT
TUBE CONNECTING 12'X1/4 (SUCTIONS) ×1
TUBE CONNECTING 12X1/4 (SUCTIONS) ×2 IMPLANT
UNDERPAD 30X30 INCONTINENT (UNDERPADS AND DIAPERS) ×3 IMPLANT
WATER STERILE IRR 1000ML POUR (IV SOLUTION) IMPLANT
XTRAFIX 11 X 150 ×6 IMPLANT

## 2015-06-18 NOTE — Progress Notes (Signed)
Orthopedic Tech Progress Note Patient Details:  Jackson Gray Jan 01, 1946 [id number]  Ortho Devices Ortho Device/Splint Location: trapeze bar patient helper   Hildred Priest 06/18/2015, 3:22 PM

## 2015-06-18 NOTE — Brief Op Note (Signed)
06/17/2015 - 06/18/2015  12:55 PM  PATIENT:  Jackson Gray  69 y.o. male  PRE-OPERATIVE DIAGNOSIS:  left ankle trimalleolar fracture with recurrent dislocation  POST-OPERATIVE DIAGNOSIS: left ankle trimalleolar fracture with recurrent dislocation  PROCEDURE:  Procedure(s): 1. CLOSED REDUCTION OF ANKLE DISLOCATION 2. CLOSED REDUCTION OF TRIMALLEOLAR FRACTURE 3. EXTERNAL FIXATION (ORIF) LEFT ANKLE FRACTURE (Left) 4. INCISION AND DRAINAGE OF BULLAE MEDIAL AND LATERAL  SURGEON:  Surgeon(s) and Role:    * Altamese [place], MD - Primary  PHYSICIAN ASSISTANT: Ainsley Spinner, PA-C  ANESTHESIA:   general  I/O:  Total I/O In: 600 [I.V.:600] Out: 205 [Urine:200; Blood:5]  SPECIMEN:  No Specimen  TOURNIQUET:  * No tourniquets in log *  DICTATION: .Other Dictation: Dictation Number [id number]

## 2015-06-18 NOTE — Anesthesia Postprocedure Evaluation (Signed)
Anesthesia Post Note  Patient: Jackson Gray  Procedure(s) Performed: Procedure(s) (LRB): EXTERNAL FIXATION (ORIF) LEFT ANKLE FRACTURE (Left)  Anesthesia type: General + AC/pop blocks  Patient location: PACU  Post pain: Pain level controlled  Post assessment: Post-op Vital signs reviewed  Last Vitals: BP 128/70 mmHg  Pulse 76  Temp(Src) 36.7 C (Oral)  Resp 13  SpO2 98%  Post vital signs: Reviewed  Level of consciousness: sedated  Complications: No apparent anesthesia complications

## 2015-06-18 NOTE — Anesthesia Preprocedure Evaluation (Addendum)
Anesthesia Evaluation  Patient identified by MRN, date of birth, ID band Patient awake    Reviewed: Allergy & Precautions, NPO status , Patient's Chart, lab work & pertinent test results  Airway Mallampati: II  TM Distance: >3 FB Neck ROM: Full    Dental  (+) Teeth Intact, Dental Advisory Given   Pulmonary sleep apnea , former smoker,    breath sounds clear to auscultation       Cardiovascular negative cardio ROS   Rhythm:Regular Rate:Normal     Neuro/Psych negative neurological ROS  negative psych ROS   GI/Hepatic negative GI ROS, Neg liver ROS,   Endo/Other  Hypothyroidism   Renal/GU negative Renal ROS     Musculoskeletal negative musculoskeletal ROS (+)   Abdominal   Peds  Hematology negative hematology ROS (+)   Anesthesia Other Findings   Reproductive/Obstetrics                           Anesthesia Physical Anesthesia Plan  ASA: II  Anesthesia Plan: General   Post-op Pain Management:    Induction: Intravenous  Airway Management Planned: LMA  Additional Equipment:   Intra-op Plan:   Post-operative Plan: Extubation in OR  Informed Consent: I have reviewed the patients History and Physical, chart, labs and discussed the procedure including the risks, benefits and alternatives for the proposed anesthesia with the patient or authorized representative who has indicated his/her understanding and acceptance.   Dental advisory given  Plan Discussed with: CRNA  Anesthesia Plan Comments:         Anesthesia Quick Evaluation

## 2015-06-18 NOTE — Transfer of Care (Signed)
Immediate Anesthesia Transfer of Care Note  Patient: Jackson Gray  Procedure(s) Performed: Procedure(s): EXTERNAL FIXATION (ORIF) LEFT ANKLE FRACTURE (Left)  Patient Location: PACU  Anesthesia Type:General  Level of Consciousness: awake, oriented and patient cooperative  Airway & Oxygen Therapy: Patient Spontanous Breathing and Patient connected to nasal cannula oxygen  Post-op Assessment: Report given to RN and Post -op Vital signs reviewed and stable  Post vital signs: Reviewed and stable  Last Vitals:  Filed Vitals:   06/18/15 1100  BP: 112/64  Pulse: 68  Temp:   Resp: 13    Complications: No apparent anesthesia complications

## 2015-06-18 NOTE — Anesthesia Procedure Notes (Addendum)
Procedure Name: LMA Insertion Date/Time: 06/18/2015 11:29 AM Performed by: Williemae Area B Pre-anesthesia Checklist: Patient identified, Emergency Drugs available, Suction available and Patient being monitored Patient Re-evaluated:Patient Re-evaluated prior to inductionOxygen Delivery Method: Circle system utilized Preoxygenation: Pre-oxygenation with 100% oxygen Intubation Type: IV induction Ventilation: Mask ventilation without difficulty LMA: LMA inserted LMA Size: 4.0 Number of attempts: 1 Placement Confirmation: positive ETCO2 and breath sounds checked- equal and bilateral Tube secured with: Tape (taped across cheeks) Dental Injury: Teeth and Oropharynx as per pre-operative assessment    Anesthesia Regional Block:  Adductor canal block  Pre-Anesthetic Checklist: ,, timeout performed, Correct Patient, Correct Site, Correct Laterality, Correct Procedure, Correct Position, site marked, Risks and benefits discussed, Surgical consent,  Pre-op evaluation,  Post-op pain management  Laterality: Left  Prep: chloraprep       Needles:  Injection technique: Single-shot  Needle Type: Stimiplex     Needle Length: 9cm 9 cm Needle Gauge: 21 and 21 G    Additional Needles:  Procedures: ultrasound guided (picture in chart) Adductor canal block Narrative:  Injection made incrementally with aspirations every 5 mL.  Performed by: Personally  Anesthesiologist: Nolon Nations  Additional Notes: BP cuff, EKG monitors applied. Sedation begun. Artery and nerve location verified with U/S and anesthetic injected incrementally, slowly, and after negative aspirations under direct u/s guidance. Good fascial /perineural spread. Tolerated well.   Anesthesia Regional Block:  Popliteal block  Pre-Anesthetic Checklist: ,, timeout performed, Correct Patient, Correct Site, Correct Laterality, Correct Procedure, Correct Position, site marked, Risks and benefits discussed, Surgical consent,  Pre-op  evaluation,  Post-op pain management  Laterality: Left  Prep: chloraprep       Needles:  Injection technique: Single-shot  Needle Type: Stimiplex     Needle Length: 10cm 10 cm Needle Gauge: 21 and 21 G    Additional Needles:  Procedures: ultrasound guided (picture in chart)  Motor weakness within 5 minutes. Popliteal block  Nerve Stimulator or Paresthesia:  Response: 0.5 mA,   Additional Responses:   Narrative:  Injection made incrementally with aspirations every 5 mL.  Performed by: Personally  Anesthesiologist: Nolon Nations  Additional Notes: Nerve located and needle positioned with direct ultrasound guidance. Good perineural spread. Patient tolerated well.

## 2015-06-18 NOTE — Progress Notes (Signed)
Report to J. Elnoria Howard RN as primary caregiver

## 2015-06-18 NOTE — Progress Notes (Signed)
Utilization review completed. Bertha Stanfill, RN, BSN. 

## 2015-06-18 NOTE — Discharge Instructions (Signed)
[facility] Discharge Instructions   General Discharge Instructions  WEIGHT BEARING STATUS:nonweightbearing left leg  RANGE OF MOTION/ACTIVITY: knee range of motion as tolerate, wiggle toes as much as possible to help control swelling  PAIN MEDICATION USE AND EXPECTATIONS  You have likely been given narcotic medications to help control your pain.  After a traumatic event that results in an fracture (broken bone) with or without surgery, it is ok to use narcotic pain medications to help control one's pain.  We understand that everyone responds to pain differently and each individual patient will be evaluated on a regular basis for the continued need for narcotic medications. Ideally, narcotic medication use should last no more than 6-8 weeks (coinciding with fracture healing).   As a patient it is your responsibility as well to monitor narcotic medication use and report the amount and frequency you use these medications when you come to your office visit.   We would also advise that if you are using narcotic medications, you should take a dose prior to therapy to maximize you participation.  IF YOU ARE ON NARCOTIC MEDICATIONS IT IS NOT PERMISSIBLE TO OPERATE A MOTOR VEHICLE (MOTORCYCLE/CAR/TRUCK/MOPED) OR HEAVY MACHINERY DO NOT MIX NARCOTICS WITH OTHER CNS (CENTRAL NERVOUS SYSTEM) DEPRESSANTS SUCH AS ALCOHOL  Diet: as you were eating previously.  Can use over the counter stool softeners and bowel preparations, such as Miralax, to help with bowel movements.  Narcotics can be constipating.  Be sure to drink plenty of fluids  Wound Care: wound care every other day. Please see instructions below   STOP SMOKING OR USING NICOTINE PRODUCTS!!!!  As discussed nicotine severely impairs your body's ability to heal surgical and traumatic wounds but also impairs bone healing.  Wounds and bone heal by forming microscopic blood vessels (angiogenesis) and nicotine is a vasoconstrictor  (essentially, shrinks blood vessels).  Therefore, if vasoconstriction occurs to these microscopic blood vessels they essentially disappear and are unable to deliver necessary nutrients to the healing tissue.  This is one modifiable factor that you can do to dramatically increase your chances of healing your injury.    (This means no smoking, no nicotine gum, patches, etc)  DO NOT USE NONSTEROIDAL ANTI-INFLAMMATORY DRUGS (NSAID'S)  Using products such as Advil (ibuprofen), Aleve (naproxen), Motrin (ibuprofen) for additional pain control during fracture healing can delay and/or prevent the healing response.  If you would like to take over the counter (OTC) medication, Tylenol (acetaminophen) is ok.  However, some narcotic medications that are given for pain control contain acetaminophen as well. Therefore, you should not exceed more than 4000 mg of tylenol in a day if you do not have liver disease.  Also note that there are may OTC medicines, such as cold medicines and allergy medicines that my contain tylenol as well.  If you have any questions about medications and/or interactions please ask your doctor/PA or your pharmacist.      ICE AND ELEVATE INJURED/OPERATIVE EXTREMITY  Using ice and elevating the injured extremity above your heart can help with swelling and pain control.  Icing in a pulsatile fashion, such as 20 minutes on and 20 minutes off, can be followed.    Do not place ice directly on skin. Make sure there is a barrier between to skin and the ice pack.    Using frozen items such as frozen peas works well as the conform nicely to the are that needs to be iced.  USE AN ACE WRAP OR TED HOSE FOR SWELLING  CONTROL  In addition to icing and elevation, Ace wraps or TED hose are used to help limit and resolve swelling.  It is recommended to use Ace wraps or TED hose until you are informed to stop.    When using Ace Wraps start the wrapping distally (farthest away from the body) and wrap proximally  (closer to the body)   Example: If you had surgery on your leg or thing and you do not have a splint on, start the ace wrap at the toes and work your way up to the thigh        If you had surgery on your upper extremity and do not have a splint on, start the ace wrap at your fingers and work your way up to the upper arm  IF YOU ARE IN A SPLINT OR CAST DO NOT [place]   If your splint gets wet for any reason please contact the office immediately. You may shower in your splint or cast as long as you keep it dry.  This can be done by wrapping in a cast cover or garbage back (or similar)  Do Not stick any thing down your splint or cast such as pencils, money, or hangers to try and scratch yourself with.  If you feel itchy take benadryl as prescribed on the bottle for itching  IF YOU ARE IN A CAM BOOT (BLACK BOOT)  You may remove boot periodically. Perform daily dressing changes as noted below.  Wash the liner of the boot regularly and wear a sock when wearing the boot. It is recommended that you sleep in the boot until told otherwise  CALL THE OFFICE WITH ANY QUESTIONS OR CONCERTS: [phone]     Discharge Pin Site Instructions  Dress pins daily with Kerlix roll starting on POD 2. Wrap the Kerlix so that it tamps the skin down around the pin-skin interface to prevent/limit motion of the skin relative to the pin.  (Pin-skin motion is the primary cause of pain and infection related to external fixator pin sites).  Remove any crust or coagulum that may obstruct drainage with a saline moistened gauze or soap and water.  After POD 3, if there is no discernable drainage on the pin site dressing, the interval for change can by increased to every other day.  You may shower with the fixator, cleaning all pin sites gently with soap and water.  If you have a surgical wound this needs to be completely dry and without drainage before showering.  The extremity can be lifted by the fixator to  facilitate wound care and transfers.  Notify the office/Doctor if you experience increasing drainage, redness, or pain from a pin site, or if you notice purulent (thick, snot-like) drainage.  Discharge Wound Care Instructions  Do NOT apply any ointments, solutions or lotions to pin sites or surgical wounds.  These prevent needed drainage and even though solutions like hydrogen peroxide kill bacteria, they also damage cells lining the pin sites that help fight infection.  Applying lotions or ointments can keep the wounds moist and can cause them to breakdown and open up as well. This can increase the risk for infection. When in doubt call the office.  Surgical incisions should be dressed daily.  If any drainage is noted, use one layer of adaptic, then gauze, Kerlix, and an ace wrap.  Once the incision is completely dry and without drainage, it may be left open to air out.  Showering may  begin 36-48 hours later.  Cleaning gently with soap and water.  Traumatic wounds should be dressed daily as well.    One layer of adaptic, gauze, Kerlix, then ace wrap.  The adaptic can be discontinued once the draining has ceased    If you have a wet to dry dressing: wet the gauze with saline the squeeze as much saline out so the gauze is moist (not soaking wet), place moistened gauze over wound, then place a dry gauze over the moist one, followed by Kerlix wrap, then ace wrap.

## 2015-06-19 ENCOUNTER — Encounter (HOSPITAL_COMMUNITY): Payer: Self-pay | Admitting: Orthopedic Surgery

## 2015-06-19 LAB — BASIC METABOLIC PANEL
Anion gap: 9 (ref 5–15)
BUN: 9 mg/dL (ref 6–20)
CO2: 27 mmol/L (ref 22–32)
Calcium: 8.9 mg/dL (ref 8.9–10.3)
Chloride: 106 mmol/L (ref 101–111)
Creatinine, Ser: 1.04 mg/dL (ref 0.61–1.24)
GFR calc Af Amer: >60 mL/min (ref >60)
GLUCOSE: 113 mg/dL — AB (ref 65–99)
POTASSIUM: 3.6 mmol/L (ref 3.5–5.1)
Sodium: 142 mmol/L (ref 135–145)

## 2015-06-19 LAB — CBC
HCT: 39.4 % (ref 39.0–52.0)
Hemoglobin: 13.3 g/dL (ref 13.0–17.0)
MCH: 29 pg (ref 26.0–34.0)
MCHC: 33.8 g/dL (ref 30.0–36.0)
MCV: 85.8 fL (ref 78.0–100.0)
PLATELETS: 191 10*3/uL (ref 150–400)
RBC: 4.59 MIL/uL (ref 4.22–5.81)
RDW: 13.8 % (ref 11.5–15.5)
WBC: 8.9 10*3/uL (ref 4.0–10.5)
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
## 2015-06-19 NOTE — Progress Notes (Signed)
[facility] (OTS)  Subjective: Pain well controlled  Objective: AFVSS Physical Exam Slight drainage from blisters, outstanding toe motion, no N/V deficits  Assessment/Plan: 1. D/c home 2. F/u on Wed for dressing change 3. NWB on LLE  Altamese Bristol, MD [facility], [place] [phone] (p)
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
## 2015-06-19 NOTE — Care Management Important Message (Signed)
Important Message  Patient Details  Name: Jackson Gray MRN: [id number] Date of Birth: Oct 07, 1945   Medicare Important Message Given:  Yes-second notification given    Loann Quill 06/19/2015, 11:12 AM
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
## 2015-07-02 ENCOUNTER — Inpatient Hospital Stay (HOSPITAL_COMMUNITY)
Admission: RE | Admit: 2015-07-02 | Discharge: 2015-07-02 | Disposition: A | Discharge status: Home / Self Care | Payer: Medicare Other | Source: Ambulatory Visit

## 2015-07-21 ENCOUNTER — Ambulatory Visit (HOSPITAL_COMMUNITY): Admission: RE | Admit: 2015-07-21 | Payer: Medicare Other | Source: Ambulatory Visit | Admitting: Orthopedic Surgery

## 2015-07-21 ENCOUNTER — Encounter (HOSPITAL_COMMUNITY): Admission: RE | Payer: Self-pay | Source: Ambulatory Visit

## 2015-07-21 SURGERY — OPEN REDUCTION INTERNAL FIXATION (ORIF) ANKLE FRACTURE
Anesthesia: General | Site: Ankle | Laterality: Left

## 2015-08-17 ENCOUNTER — Encounter (HOSPITAL_COMMUNITY): Payer: Self-pay | Admitting: *Deleted

## 2015-08-17 NOTE — H&P (Signed)
Orthopaedic Trauma Service   Chief Complaint: retained ex fix Left ankle HPI:   69 y/o male s/p ex fix left trimalleolar ankle fracture. Pt treated with ex fix. Swelling never resolved enough to allow for plate osteosynthesis but the reduction was good enough in the ex fix to allow for definitive treatment in it. Pt presents today for removal of ex fix   Past Medical History  Diagnosis Date  . 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 BPH (benign prostatic hyperplasia)   . Hypothyroidism   . Basal cell carcinoma     "forehead; left forearm" (06/17/2015)  . Sleep apnea     "probably; never checked for it" (06/17/2015)    Past Surgical History  Procedure Laterality Date  . Shoulder open rotator cuff repair Right 2015  . Ankle closed reduction Left 06/12/2015; 06/15/2015  . Appendectomy  1960's  . Cardiac catheterization  1998  . Basal cell carcinoma excision      "forehead; arm"  . Orif ankle fracture Left 06/18/2015    Procedure: EXTERNAL FIXATION (ORIF) LEFT ANKLE FRACTURE;  Surgeon: Altamese [place], MD;  Location: [facility];  Service: Orthopedics;  Laterality: Left;    No family history on file. Social History:  reports that he has quit smoking. His smoking use included Cigarettes. He has a 52.5 pack-year smoking history. He has never used smokeless tobacco. He reports that he drinks alcohol. He reports that he does not use illicit drugs.  Allergies:  Allergies  Allergen Reactions  . Sulfacetamide Sodium Other (See Comments)    Unknown reaction during childhood    No current facility-administered medications on file prior to encounter.   Current Outpatient Prescriptions on File Prior to Encounter  Medication Sig Dispense Refill  . Cholecalciferol (VITAMIN D) 2000 UNITS CAPS Take 2,000 Units by mouth daily.    . Cyanocobalamin (VITAMIN B 12 PO) Take 1 tablet by mouth daily.    Marland Kitchen levothyroxine (SYNTHROID, LEVOTHROID) 75 MCG tablet Take 75 mcg by mouth daily.  3   . methocarbamol (ROBAXIN) 500 MG tablet Take 1-2 tablets (500-1,000 mg total) by mouth every 6 (six) hours as needed for muscle spasms. 90 tablet 1  . Multiple Vitamin (MULTIVITAMIN WITH MINERALS) TABS tablet Take 1 tablet by mouth daily.      No prescriptions prior to admission    No results found for this or any previous visit (from the past 48 hour(s)). No results found.  Review of Systems  Constitutional: Negative for fever and chills.  Eyes: Negative for blurred vision and double vision.  Respiratory: Negative for shortness of breath and wheezing.   Cardiovascular: Negative for chest pain and palpitations.  Gastrointestinal: Negative for nausea and vomiting.  Neurological: Negative for tingling, sensory change and headaches.     Physical Exam  Constitutional: He appears well-developed and well-nourished.  Musculoskeletal:  Left Lower extremity   Soft tissue improved   nontender L ankle    Motor and sensory functions grossly intact    Ext warm    + DP pulse   Ex fix stable    pinsites look great       Assessment/Plan  69 y/o male with retained ex fix L ankle s/p L trimall ankle fracture-dislocation  OR for removal of ex fix Will place in splint Begin WB after office follow up outpt procedure   Jari Pigg, PA-C [facility] [phone] (P) 08/17/2015, 4:19 PM

## 2015-08-17 NOTE — Progress Notes (Signed)
Pt denies SOB, chest pain, and being under the care of a cardiologist. Pt denies having a stress test, echo and cardiac cath. Pt denies having a history of sleep apnea. Pt made aware to stop  taking Aspirin, otc vitamins, fish oil, and herbal medications. Do not take any NSAIDs ie: Ibuprofen, Advil, Naproxen or any medication containing Aspirin. Pt verbalized understanding of all pre-op instructions.

## 2015-08-18 ENCOUNTER — Ambulatory Visit (HOSPITAL_COMMUNITY): Payer: Medicare Other

## 2015-08-18 ENCOUNTER — Encounter (HOSPITAL_COMMUNITY)
Admission: RE | Disposition: A | Discharge status: Home / Self Care | Payer: Self-pay | Source: Ambulatory Visit | Attending: Orthopedic Surgery

## 2015-08-18 ENCOUNTER — Ambulatory Visit (HOSPITAL_COMMUNITY): Payer: Medicare Other | Admitting: Anesthesiology

## 2015-08-18 ENCOUNTER — Ambulatory Visit (HOSPITAL_COMMUNITY)
Admission: RE | Admit: 2015-08-18 | Discharge: 2015-08-18 | Disposition: A | Discharge status: Home / Self Care | Payer: Medicare Other | Source: Ambulatory Visit | Attending: Orthopedic Surgery | Admitting: Orthopedic Surgery

## 2015-08-18 DIAGNOSIS — N400 Enlarged prostate without lower urinary tract symptoms: Secondary | ICD-10-CM | POA: Insufficient documentation

## 2015-08-18 DIAGNOSIS — E039 Hypothyroidism, unspecified: Secondary | ICD-10-CM | POA: Diagnosis not present

## 2015-08-18 DIAGNOSIS — Z87891 Personal history of nicotine dependence: Secondary | ICD-10-CM | POA: Insufficient documentation

## 2015-08-18 DIAGNOSIS — X58XXXD Exposure to other specified factors, subsequent encounter: Secondary | ICD-10-CM | POA: Insufficient documentation

## 2015-08-18 DIAGNOSIS — S82852D Displaced trimalleolar fracture of left lower leg, subsequent encounter for closed fracture with routine healing: Secondary | ICD-10-CM | POA: Insufficient documentation

## 2015-08-18 DIAGNOSIS — G4730 Sleep apnea, unspecified: Secondary | ICD-10-CM | POA: Insufficient documentation

## 2015-08-18 DIAGNOSIS — Z419 Encounter for procedure for purposes other than remedying health state, unspecified: Secondary | ICD-10-CM

## 2015-08-18 DIAGNOSIS — S82852A Displaced trimalleolar fracture of left lower leg, initial encounter for closed fracture: Secondary | ICD-10-CM | POA: Diagnosis present

## 2015-08-18 DIAGNOSIS — Z79899 Other long term (current) drug therapy: Secondary | ICD-10-CM | POA: Insufficient documentation

## 2015-08-18 HISTORY — PX: EXTERNAL FIXATION REMOVAL: SHX5040

## 2015-08-18 LAB — CBC
HCT: 41.8 % (ref 39.0–52.0)
HEMOGLOBIN: 14.2 g/dL (ref 13.0–17.0)
MCH: 29 pg (ref 26.0–34.0)
MCHC: 34 g/dL (ref 30.0–36.0)
MCV: 85.3 fL (ref 78.0–100.0)
Platelets: 190 10*3/uL (ref 150–400)
RBC: 4.9 MIL/uL (ref 4.22–5.81)
RDW: 13.5 % (ref 11.5–15.5)
WBC: 6.2 10*3/uL (ref 4.0–10.5)

## 2015-08-18 LAB — BASIC METABOLIC PANEL
ANION GAP: 8 (ref 5–15)
BUN: 6 mg/dL (ref 6–20)
CALCIUM: 9.3 mg/dL (ref 8.9–10.3)
CHLORIDE: 104 mmol/L (ref 101–111)
CO2: 27 mmol/L (ref 22–32)
Creatinine, Ser: 1.07 mg/dL (ref 0.61–1.24)
GFR calc non Af Amer: >60 mL/min (ref >60)
Glucose, Bld: 114 mg/dL — ABNORMAL HIGH (ref 65–99)
Potassium: 3.4 mmol/L — ABNORMAL LOW (ref 3.5–5.1)
Sodium: 139 mmol/L (ref 135–145)

## 2015-08-18 SURGERY — REMOVAL, EXTERNAL FIXATION DEVICE, LOWER EXTREMITY
Anesthesia: General | Site: Leg Lower | Laterality: Left

## 2015-08-18 MED ORDER — SODIUM CHLORIDE 0.9 % IJ SOLN
INTRAMUSCULAR | Status: AC
  Filled 2015-08-18: qty 10

## 2015-08-18 MED ORDER — CHLORHEXIDINE GLUCONATE 4 % EX LIQD: 60.0000 mL | Freq: Once | CUTANEOUS | Status: DC

## 2015-08-18 MED ORDER — KETOROLAC TROMETHAMINE 10 MG PO TABS: 10.0000 mg | ORAL_TABLET | Freq: Four times a day (QID) | ORAL | Status: DC | PRN

## 2015-08-18 MED ORDER — NEOSTIGMINE METHYLSULFATE 10 MG/10ML IV SOLN
INTRAVENOUS | Status: AC
  Filled 2015-08-18: qty 1

## 2015-08-18 MED ORDER — ONDANSETRON HCL 4 MG/2ML IJ SOLN
INTRAMUSCULAR | Status: DC | PRN
  Administered 2015-08-18: 4 mg via INTRAVENOUS

## 2015-08-18 MED ORDER — ARTIFICIAL TEARS OP OINT
TOPICAL_OINTMENT | OPHTHALMIC | Status: AC
  Filled 2015-08-18: qty 3.5

## 2015-08-18 MED ORDER — LIDOCAINE HCL (CARDIAC) 20 MG/ML IV SOLN
INTRAVENOUS | Status: DC | PRN
  Administered 2015-08-18: 80 mg via INTRAVENOUS

## 2015-08-18 MED ORDER — 0.9 % SODIUM CHLORIDE (POUR BTL) OPTIME
TOPICAL | Status: DC | PRN
  Administered 2015-08-18: 1000 mL

## 2015-08-18 MED ORDER — ONDANSETRON HCL 4 MG/2ML IJ SOLN
INTRAMUSCULAR | Status: AC
  Filled 2015-08-18: qty 2

## 2015-08-18 MED ORDER — CEFAZOLIN SODIUM-DEXTROSE 2-3 GM-% IV SOLR
2.0000 g | INTRAVENOUS | Status: AC
  Administered 2015-08-18: 2 g via INTRAVENOUS
  Filled 2015-08-18: qty 50

## 2015-08-18 MED ORDER — SUCCINYLCHOLINE CHLORIDE 20 MG/ML IJ SOLN
INTRAMUSCULAR | Status: AC
  Filled 2015-08-18: qty 1

## 2015-08-18 MED ORDER — FENTANYL CITRATE (PF) 250 MCG/5ML IJ SOLN
INTRAMUSCULAR | Status: AC
  Filled 2015-08-18: qty 5

## 2015-08-18 MED ORDER — PROPOFOL 10 MG/ML IV BOLUS
INTRAVENOUS | Status: AC
  Filled 2015-08-18: qty 20

## 2015-08-18 MED ORDER — ROCURONIUM BROMIDE 50 MG/5ML IV SOLN
INTRAVENOUS | Status: AC
  Filled 2015-08-18: qty 1

## 2015-08-18 MED ORDER — LIDOCAINE HCL (CARDIAC) 20 MG/ML IV SOLN
INTRAVENOUS | Status: AC
  Filled 2015-08-18: qty 5

## 2015-08-18 MED ORDER — ONDANSETRON HCL 4 MG/2ML IJ SOLN: 4.0000 mg | Freq: Once | INTRAMUSCULAR | Status: DC | PRN

## 2015-08-18 MED ORDER — PHENYLEPHRINE 40 MCG/ML (10ML) SYRINGE FOR IV PUSH (FOR BLOOD PRESSURE SUPPORT)
PREFILLED_SYRINGE | INTRAVENOUS | Status: AC
  Filled 2015-08-18: qty 10

## 2015-08-18 MED ORDER — FENTANYL CITRATE (PF) 100 MCG/2ML IJ SOLN
INTRAMUSCULAR | Status: DC | PRN
  Administered 2015-08-18 (×2): 50 ug via INTRAVENOUS

## 2015-08-18 MED ORDER — LACTATED RINGERS IV SOLN
INTRAVENOUS | Status: DC | PRN
  Administered 2015-08-18: 08:00:00 via INTRAVENOUS

## 2015-08-18 MED ORDER — EPHEDRINE SULFATE 50 MG/ML IJ SOLN
INTRAMUSCULAR | Status: AC
  Filled 2015-08-18: qty 1

## 2015-08-18 MED ORDER — PROPOFOL 10 MG/ML IV BOLUS
INTRAVENOUS | Status: DC | PRN
  Administered 2015-08-18: 200 mg via INTRAVENOUS

## 2015-08-18 MED ORDER — FENTANYL CITRATE (PF) 100 MCG/2ML IJ SOLN: 25.0000 ug | INTRAMUSCULAR | Status: DC | PRN

## 2015-08-18 MED ORDER — GLYCOPYRROLATE 0.2 MG/ML IJ SOLN
INTRAMUSCULAR | Status: AC
  Filled 2015-08-18: qty 2

## 2015-08-18 MED ORDER — OXYCODONE-ACETAMINOPHEN 5-325 MG PO TABS: 1.0000 | ORAL_TABLET | Freq: Four times a day (QID) | ORAL | Status: DC | PRN

## 2015-08-18 SURGICAL SUPPLY — 65 items
BANDAGE ELASTIC 4 VELCRO ST LF (GAUZE/BANDAGES/DRESSINGS) ×3 IMPLANT
BANDAGE ELASTIC 6 VELCRO ST LF (GAUZE/BANDAGES/DRESSINGS) ×3 IMPLANT
BANDAGE ESMARK 6X9 LF (GAUZE/BANDAGES/DRESSINGS) ×1 IMPLANT
BNDG COHESIVE 6X5 TAN STRL LF (GAUZE/BANDAGES/DRESSINGS) ×3 IMPLANT
BNDG ESMARK 6X9 LF (GAUZE/BANDAGES/DRESSINGS) ×3
BNDG GAUZE ELAST 4 BULKY (GAUZE/BANDAGES/DRESSINGS) ×6 IMPLANT
BRUSH SCRUB DISP (MISCELLANEOUS) ×6 IMPLANT
CLEANER TIP ELECTROSURG 2X2 (MISCELLANEOUS) ×3 IMPLANT
CLOSURE WOUND 1/2 X4 (GAUZE/BANDAGES/DRESSINGS)
COVER SURGICAL LIGHT HANDLE (MISCELLANEOUS) ×6 IMPLANT
CUFF TOURNIQUET SINGLE 18IN (TOURNIQUET CUFF) IMPLANT
CUFF TOURNIQUET SINGLE 24IN (TOURNIQUET CUFF) IMPLANT
CUFF TOURNIQUET SINGLE 34IN LL (TOURNIQUET CUFF) IMPLANT
DRAPE C-ARM 42X72 X-RAY (DRAPES) IMPLANT
DRAPE C-ARMOR (DRAPES) ×3 IMPLANT
DRAPE OEC MINIVIEW 54X84 (DRAPES) ×3 IMPLANT
DRAPE U-SHAPE 47X51 STRL (DRAPES) ×3 IMPLANT
DRSG ADAPTIC 3X8 NADH LF (GAUZE/BANDAGES/DRESSINGS) ×3 IMPLANT
ELECT REM PT RETURN 9FT ADLT (ELECTROSURGICAL) ×3
ELECTRODE REM PT RTRN 9FT ADLT (ELECTROSURGICAL) ×1 IMPLANT
EVACUATOR 1/8 PVC DRAIN (DRAIN) IMPLANT
GAUZE SPONGE 4X4 12PLY STRL (GAUZE/BANDAGES/DRESSINGS) ×3 IMPLANT
GLOVE BIO SURGEON STRL SZ7.5 (GLOVE) ×3 IMPLANT
GLOVE BIO SURGEON STRL SZ8 (GLOVE) ×3 IMPLANT
GLOVE BIOGEL PI IND STRL 7.5 (GLOVE) ×1 IMPLANT
GLOVE BIOGEL PI IND STRL 8 (GLOVE) ×1 IMPLANT
GLOVE BIOGEL PI INDICATOR 7.5 (GLOVE) ×2
GLOVE BIOGEL PI INDICATOR 8 (GLOVE) ×2
GOWN STRL REUS W/ TWL LRG LVL3 (GOWN DISPOSABLE) ×2 IMPLANT
GOWN STRL REUS W/ TWL XL LVL3 (GOWN DISPOSABLE) ×1 IMPLANT
GOWN STRL REUS W/TWL LRG LVL3 (GOWN DISPOSABLE) ×4
GOWN STRL REUS W/TWL XL LVL3 (GOWN DISPOSABLE) ×2
KIT BASIN OR (CUSTOM PROCEDURE TRAY) ×3 IMPLANT
KIT ROOM TURNOVER OR (KITS) ×3 IMPLANT
MANIFOLD NEPTUNE II (INSTRUMENTS) ×3 IMPLANT
NEEDLE 22X1 1/2 (OR ONLY) (NEEDLE) IMPLANT
NS IRRIG 1000ML POUR BTL (IV SOLUTION) ×3 IMPLANT
PACK ORTHO EXTREMITY (CUSTOM PROCEDURE TRAY) ×3 IMPLANT
PAD ARMBOARD 7.5X6 YLW CONV (MISCELLANEOUS) ×6 IMPLANT
PAD CAST 4YDX4 CTTN HI CHSV (CAST SUPPLIES) ×1 IMPLANT
PADDING CAST COTTON 4X4 STRL (CAST SUPPLIES) ×2
PADDING CAST COTTON 6X4 STRL (CAST SUPPLIES) ×3 IMPLANT
SPLINT PLASTER CAST XFAST 5X30 (CAST SUPPLIES) ×3 IMPLANT
SPLINT PLASTER XFAST SET 5X30 (CAST SUPPLIES) ×6
SPONGE GAUZE 4X4 12PLY STER LF (GAUZE/BANDAGES/DRESSINGS) ×3 IMPLANT
SPONGE LAP 18X18 X RAY DECT (DISPOSABLE) ×3 IMPLANT
SPONGE SCRUB IODOPHOR (GAUZE/BANDAGES/DRESSINGS) ×3 IMPLANT
STAPLER VISISTAT 35W (STAPLE) IMPLANT
STOCKINETTE IMPERVIOUS LG (DRAPES) ×3 IMPLANT
STRIP CLOSURE SKIN 1/2X4 (GAUZE/BANDAGES/DRESSINGS) IMPLANT
SUCTION FRAZIER TIP 10 FR DISP (SUCTIONS) IMPLANT
SUT ETHILON 3 0 PS 1 (SUTURE) ×3 IMPLANT
SUT PDS AB 2-0 CT1 27 (SUTURE) IMPLANT
SUT VIC AB 0 CT1 27 (SUTURE)
SUT VIC AB 0 CT1 27XBRD ANBCTR (SUTURE) IMPLANT
SUT VIC AB 2-0 CT1 27 (SUTURE)
SUT VIC AB 2-0 CT1 TAPERPNT 27 (SUTURE) IMPLANT
SYR CONTROL 10ML LL (SYRINGE) IMPLANT
TOWEL OR 17X24 6PK STRL BLUE (TOWEL DISPOSABLE) ×6 IMPLANT
TOWEL OR 17X26 10 PK STRL BLUE (TOWEL DISPOSABLE) ×6 IMPLANT
TUBE CONNECTING 12'X1/4 (SUCTIONS) ×1
TUBE CONNECTING 12X1/4 (SUCTIONS) ×2 IMPLANT
UNDERPAD 30X30 INCONTINENT (UNDERPADS AND DIAPERS) ×3 IMPLANT
WATER STERILE IRR 1000ML POUR (IV SOLUTION) ×6 IMPLANT
YANKAUER SUCT BULB TIP NO VENT (SUCTIONS) ×3 IMPLANT

## 2015-08-18 NOTE — Anesthesia Postprocedure Evaluation (Signed)
Anesthesia Post Note  Patient: Jackson Gray  Procedure(s) Performed: Procedure(s) (LRB): REMOVAL EXTERNAL FIXATION LEFT  LEG (Left)  Patient location during evaluation: PACU Anesthesia Type: General Level of consciousness: awake and alert Pain management: pain level controlled Vital Signs Assessment: post-procedure vital signs reviewed and stable Respiratory status: spontaneous breathing, nonlabored ventilation, respiratory function stable and patient connected to nasal cannula oxygen Cardiovascular status: blood pressure returned to baseline and stable Postop Assessment: No signs of nausea or vomiting Anesthetic complications: no    Last Vitals:  Filed Vitals:   08/18/15 0846 08/18/15 0853  BP:  130/74  Pulse: 65 76  Temp: 36.4 C   Resp: 12 16    Last Pain: There were no vitals filed for this visit.               Zenaida Deed

## 2015-08-18 NOTE — Discharge Instructions (Signed)
[facility] Discharge Instructions  General Discharge Instructions  WEIGHT BEARING STATUS: Touchdown weightbearing Left leg  RANGE OF MOTION/ACTIVITY: no range of motion of ankle until splint removed  Wound Care: leave splint on until follow up   [place] have likely been given narcotic medications to help control your pain.  After a traumatic event that results in an fracture (broken bone) with or without surgery, it is ok to use narcotic pain medications to help control one's pain.  We understand that everyone responds to pain differently and each individual patient will be evaluated on a regular basis for the continued need for narcotic medications. Ideally, narcotic medication use should last no more than 6-8 weeks (coinciding with fracture healing).   As a patient it is your responsibility as well to monitor narcotic medication use and report the amount and frequency you use these medications when you come to your office visit.   We would also advise that if you are using narcotic medications, you should take a dose prior to therapy to maximize you participation.  IF YOU ARE ON NARCOTIC MEDICATIONS IT IS NOT PERMISSIBLE TO OPERATE A MOTOR VEHICLE (MOTORCYCLE/CAR/TRUCK/MOPED) OR HEAVY MACHINERY DO NOT MIX NARCOTICS WITH OTHER CNS (CENTRAL NERVOUS SYSTEM) DEPRESSANTS SUCH AS ALCOHOL  Diet: as you were eating previously.  Can use over the counter stool softeners and bowel preparations, such as Miralax, to help with bowel movements.  Narcotics can be constipating.  Be sure to drink plenty of fluids    STOP SMOKING OR USING NICOTINE PRODUCTS!!!!  As discussed nicotine severely impairs your body's ability to heal surgical and traumatic wounds but also impairs bone healing.  Wounds and bone heal by forming microscopic blood vessels (angiogenesis) and nicotine is a vasoconstrictor (essentially, shrinks blood vessels).  Therefore, if vasoconstriction  occurs to these microscopic blood vessels they essentially disappear and are unable to deliver necessary nutrients to the healing tissue.  This is one modifiable factor that you can do to dramatically increase your chances of healing your injury.    (This means no smoking, no nicotine gum, patches, etc)  DO NOT USE NONSTEROIDAL ANTI-INFLAMMATORY DRUGS (NSAID'S)  Using products such as Advil (ibuprofen), Aleve (naproxen), Motrin (ibuprofen) for additional pain control during fracture healing can delay and/or prevent the healing response.  If you would like to take over the counter (OTC) medication, Tylenol (acetaminophen) is ok.  However, some narcotic medications that are given for pain control contain acetaminophen as well. Therefore, you should not exceed more than 4000 mg of tylenol in a day if you do not have liver disease.  Also note that there are may OTC medicines, such as cold medicines and allergy medicines that my contain tylenol as well.  If you have any questions about medications and/or interactions please ask your doctor/PA or your pharmacist.      ICE AND ELEVATE INJURED/OPERATIVE EXTREMITY  Using ice and elevating the injured extremity above your heart can help with swelling and pain control.  Icing in a pulsatile fashion, such as 20 minutes on and 20 minutes off, can be followed.    Do not place ice directly on skin. Make sure there is a barrier between to skin and the ice pack.    Using frozen items such as frozen peas works well as the conform nicely to the are that needs to be iced.  USE AN ACE WRAP OR TED HOSE FOR SWELLING CONTROL  In addition to icing and  elevation, Ace wraps or TED hose are used to help limit and resolve swelling.  It is recommended to use Ace wraps or TED hose until you are informed to stop.    When using Ace Wraps start the wrapping distally (farthest away from the body) and wrap proximally (closer to the body)   Example: If you had surgery on your leg or  thing and you do not have a splint on, start the ace wrap at the toes and work your way up to the thigh        If you had surgery on your upper extremity and do not have a splint on, start the ace wrap at your fingers and work your way up to the upper arm  IF YOU ARE IN A SPLINT OR CAST DO NOT [place]   If your splint gets wet for any reason please contact the office immediately. You may shower in your splint or cast as long as you keep it dry.  This can be done by wrapping in a cast cover or garbage back (or similar)  Do Not stick any thing down your splint or cast such as pencils, money, or hangers to try and scratch yourself with.  If you feel itchy take benadryl as prescribed on the bottle for itching  IF YOU ARE IN A CAM BOOT (BLACK BOOT)  You may remove boot periodically. Perform daily dressing changes as noted below.  Wash the liner of the boot regularly and wear a sock when wearing the boot. It is recommended that you sleep in the boot until told otherwise  [place] OR CONCERTS: [id number]

## 2015-08-18 NOTE — Transfer of Care (Signed)
Immediate Anesthesia Transfer of Care Note  Patient: Jackson Gray  Procedure(s) Performed: Procedure(s): REMOVAL EXTERNAL FIXATION LEFT  LEG (Left)  Patient Location: PACU  Anesthesia Type:General  Level of Consciousness: awake, oriented and patient cooperative  Airway & Oxygen Therapy: Patient Spontanous Breathing and Patient connected to face mask oxygen  Post-op Assessment: Report given to RN, Post -op Vital signs reviewed and stable, Patient moving all extremities and Patient moving all extremities X 4  Post vital signs: Reviewed and stable  Last Vitals: There were no vitals filed for this visit.  Complications: No apparent anesthesia complications

## 2015-08-18 NOTE — Anesthesia Preprocedure Evaluation (Addendum)
Anesthesia Evaluation  Patient identified by MRN, date of birth, ID band Patient awake    Reviewed: Allergy & Precautions, NPO status , Patient's Chart, lab work & pertinent test results  Airway Mallampati: II  TM Distance: >3 FB Neck ROM: Full    Dental  (+) Teeth Intact, Dental Advisory Given   Pulmonary sleep apnea (patient denies this history) , former smoker,    breath sounds clear to auscultation       Cardiovascular negative cardio ROS   Rhythm:Regular Rate:Normal     Neuro/Psych negative neurological ROS  negative psych ROS   GI/Hepatic negative GI ROS, Neg liver ROS,   Endo/Other  Hypothyroidism   Renal/GU negative Renal ROS     Musculoskeletal negative musculoskeletal ROS (+)   Abdominal   Peds  Hematology negative hematology ROS (+)   Anesthesia Other Findings   Reproductive/Obstetrics                           Anesthesia Physical  Anesthesia Plan  ASA: II  Anesthesia Plan: General   Post-op Pain Management:    Induction: Intravenous  Airway Management Planned: LMA  Additional Equipment:   Intra-op Plan:   Post-operative Plan: Extubation in OR  Informed Consent: I have reviewed the patients History and Physical, chart, labs and discussed the procedure including the risks, benefits and alternatives for the proposed anesthesia with the patient or authorized representative who has indicated his/her understanding and acceptance.   Dental advisory given  Plan Discussed with: CRNA and Anesthesiologist  Anesthesia Plan Comments:        Anesthesia Quick Evaluation

## 2015-08-18 NOTE — Brief Op Note (Signed)
08/18/2015  8:47 AM  PATIENT:  Jackson Gray  69 y.o. male  PRE-OPERATIVE DIAGNOSIS:  TRIMALLEOLAR ANKLE FRACTURE LEFT    POST-OPERATIVE DIAGNOSIS:  TRIMALLEOLAR ANKLE FRACTURE LEFT    PROCEDURE:  Procedure(s): 1. REMOVAL EXTERNAL FIXATION LEFT  LEG (Left)  2. Debridement of ulcerated pins tibia, calcaneus, and metatarsals  3. Stress flouro of left ankle fracture 4. Application of short leg splint  SURGEON:  Surgeon(s) and Role:    * Altamese [place], MD - Primary  PHYSICIAN ASSISTANT: Ainsley Spinner, PA-C  ANESTHESIA:   general  I/O:     SPECIMEN:  No Specimen  TOURNIQUET:  * No tourniquets in log *  DICTATION: .Other Dictation: Dictation Number [id number]

## 2015-08-18 NOTE — Op Note (Signed)
NAMEJOBAN, BHUIYAN               ACCOUNT NO.:  [id number]  MEDICAL RECORD NO.:  [id number]  LOCATION:  [facility]                         FACILITY:  [facility]  PHYSICIAN:  Astrid Divine. Marcelino Scot, M.D. DATE OF BIRTH:  Jun 14, 1946  DATE OF PROCEDURE:  08/18/2015 DATE OF DISCHARGE:                              OPERATIVE REPORT   PREOPERATIVE DIAGNOSIS:  Left trimalleolar ankle fracture.  POSTOPERATIVE DIAGNOSIS:  Left trimalleolar ankle fracture.  PROCEDURES: 1. Removal of external fixator, left leg. 2. Debridement ulcerated pins in tibia calcaneus and metatarsals     including bone. 3. Stress fluoroscopy of the left ankle under anesthesia. 4. Application of short leg splint.  SURGEON:  Astrid Divine. Marcelino Scot, M.D.  ASSISTANT:  Jari Pigg, PA-C.  ANESTHESIA:  General.  COMPLICATIONS:  None.  TOURNIQUET:  None.  ESTIMATED BLOOD LOSS:  Minimal.  DISPOSITION:  To PACU.  CONDITION:  Stable.  BRIEF SUMMARY OF INDICATIONS FOR PROCEDURE:  Jackson Gray is a 69- year-old male, who sustained a fracture dislocation over 2 months ago, treated with closed reduction spanning and external fixation.  The patient now presents for return to the OR, with removal of the fixator and debridement of his ulcerated pin sites.  I did discuss the risks and benefits of surgery including possibility of loss of reduction, occult nonunion, need of another surgery, DVT, PE, heart attack, stroke, loss of motion, pin tract infection, and multiple others.  He did wish to proceed.  BRIEF SUMMARY OF PROCEDURE:  Jackson Gray received preoperative antibiotics, taken to the operating room where general anesthesia was induced.  His fixator was then removed and the pins as well.  A thorough chlorhexidine scrub was performed.  Betadine was placed on the calcaneal pin before withdrawn and allowed to sit for the appropriate drying time. The bone tracts were completely curetted and then irrigated with saline including a through  and through wash of the calcaneus.  This was followed by application of Adaptic and gauze dressing and then a posterior and stirrup splint.  Prior to doing so, I did bring in the fluoro machine and performed a stress fluoroscopy of the fracture sites identifying no motion either at the fracture site or any talar subluxation.  The patient was taken to PACU in stable condition.  PROGNOSIS:  Jackson Gray will be touchdown weightbearing in his splint until he returns to the office for removal and conversion into a weightbearing as tolerated.  He will not require formal DVT prophylaxis at this time, and he will keep his dressing intact until return.     Astrid Divine. Marcelino Scot, M.D.     MHH/MEDQ  D:  08/18/2015  T:  08/18/2015  Job:  [id number]

## 2015-08-18 NOTE — Addendum Note (Signed)
Addendum  created 08/18/15 1857 by Izora Gala, CRNA   Modules edited: Anesthesia Events, Narrator   Narrator:  Narrator: Event Log Edited

## 2015-08-18 NOTE — Anesthesia Procedure Notes (Signed)
Procedure Name: LMA Insertion Date/Time: 08/18/2015 8:13 AM Performed by: Izora Gala Pre-anesthesia Checklist: Patient identified Patient Re-evaluated:Patient Re-evaluated prior to inductionOxygen Delivery Method: Circle system utilized Preoxygenation: Pre-oxygenation with 100% oxygen Intubation Type: IV induction LMA: LMA inserted Placement Confirmation: positive ETCO2 Dental Injury: Teeth and Oropharynx as per pre-operative assessment

## 2015-08-24 ENCOUNTER — Encounter (HOSPITAL_COMMUNITY): Payer: Self-pay | Admitting: Orthopedic Surgery

## 2015-10-22 ENCOUNTER — Encounter (HOSPITAL_COMMUNITY): Payer: Self-pay | Admitting: *Deleted

## 2015-10-26 MED ORDER — CHLORHEXIDINE GLUCONATE 4 % EX LIQD: 60.0000 mL | Freq: Once | CUTANEOUS | Status: DC

## 2015-10-26 MED ORDER — CEFAZOLIN SODIUM-DEXTROSE 2-3 GM-% IV SOLR
2.0000 g | INTRAVENOUS | Status: AC
  Administered 2015-10-27: 2 g via INTRAVENOUS
  Filled 2015-10-26: qty 50

## 2015-10-26 MED ORDER — LACTATED RINGERS IV SOLN: INTRAVENOUS | Status: DC

## 2015-10-26 NOTE — H&P (Signed)
Orthopaedic Trauma Service H&P  Chief Complaint:  Chronic L ankle syndesmotic disruption HPI:   70 y/o male treated for L ankle fracture dislocation sept 2016, treated with ex fix for definitive treatment due to swelling.  At the time of ex fix removal he had a concentric joint. Pt had late evidence of syndesmotic disruption. Pt presents today for fixation of his chronic L ankle syndesmotic disruption.   Past Medical History  Diagnosis Date  . 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 BPH (benign prostatic hyperplasia)   . Hypothyroidism   . Basal cell carcinoma     "forehead; left forearm" (06/17/2015)  . Sleep apnea     "probably; never checked for it" (06/17/2015) Pt denies 08/17/15!!!!!    Past Surgical History  Procedure Laterality Date  . Shoulder open rotator cuff repair Right 2015  . Ankle closed reduction Left 06/12/2015; 06/15/2015  . Appendectomy  1960's  . Cardiac catheterization  1998  . Basal cell carcinoma excision      "forehead; arm"  . Orif ankle fracture Left 06/18/2015    Procedure: EXTERNAL FIXATION (ORIF) LEFT ANKLE FRACTURE;  Surgeon: Altamese Trimble, MD;  Location: [facility];  Service: Orthopedics;  Laterality: Left;  . External fixation removal Left 08/18/2015    Procedure: REMOVAL EXTERNAL FIXATION LEFT  LEG;  Surgeon: Altamese , MD;  Location: [facility];  Service: Orthopedics;  Laterality: Left;    History reviewed. No pertinent family history. Social History:  reports that he has quit smoking. His smoking use included Cigarettes. He has a 52.5 pack-year smoking history. He has never used smokeless tobacco. He reports that he drinks alcohol. He reports that he does not use illicit drugs.  Allergies:  Allergies  Allergen Reactions  . Sulfacetamide Sodium Other (See Comments)    Unknown reaction during childhood     No prescriptions prior to admission    No results found for this or any previous visit (from the past 48 hour(s)). No  results found.  Review of Systems  Constitutional: Negative for fever and chills.  HENT: Negative for hearing loss.   Respiratory: Negative for shortness of breath and wheezing.   Cardiovascular: Negative for chest pain and palpitations.  Gastrointestinal: Negative for nausea and vomiting.  Neurological: Negative for tingling and headaches.    There were no vitals taken for this visit. Physical Exam  Constitutional: He is oriented to person, place, and time. He appears well-developed.  Eyes: EOM are normal. Pupils are equal, round, and reactive to light.  Cardiovascular: Normal rate and regular rhythm.   Respiratory: Effort normal and breath sounds normal. He has no wheezes.  GI: Soft. Bowel sounds are normal.  Musculoskeletal:  Left Lower Extremity    Distal motor and sensory functions intact   Ext warm    + DP pulse noted     Swelling stable   Neurological: He is alert and oriented to person, place, and time.  Skin: Skin is warm and dry.  Psychiatric: He has a normal mood and affect. His behavior is normal.    Xray     L ankle xrays show widened syndesmosis  Assessment/Plan  70 y/o male with chronic L ankle syndesmotic disruption   OR for repair of L ankle syndesmotic disruption NWB x 8 weeks outpt vs overnight observation  Risks and benefits reviewed. Pt wishes to proceed   Jari Pigg, PA-C [facility] [phone] (P)  10/26/2015, 8:05 PM

## 2015-10-27 ENCOUNTER — Ambulatory Visit (HOSPITAL_COMMUNITY): Payer: Medicare Other | Admitting: Anesthesiology

## 2015-10-27 ENCOUNTER — Encounter (HOSPITAL_COMMUNITY)
Admission: RE | Disposition: A | Discharge status: Home / Self Care | Payer: Self-pay | Source: Ambulatory Visit | Attending: Orthopedic Surgery

## 2015-10-27 ENCOUNTER — Encounter (HOSPITAL_COMMUNITY): Payer: Self-pay | Admitting: Anesthesiology

## 2015-10-27 ENCOUNTER — Ambulatory Visit (HOSPITAL_COMMUNITY)
Admission: RE | Admit: 2015-10-27 | Discharge: 2015-10-27 | Disposition: A | Discharge status: Home / Self Care | Payer: Medicare Other | Source: Ambulatory Visit | Attending: Orthopedic Surgery | Admitting: Orthopedic Surgery

## 2015-10-27 ENCOUNTER — Ambulatory Visit (HOSPITAL_COMMUNITY): Payer: Medicare Other

## 2015-10-27 DIAGNOSIS — Z87891 Personal history of nicotine dependence: Secondary | ICD-10-CM | POA: Insufficient documentation

## 2015-10-27 DIAGNOSIS — G4730 Sleep apnea, unspecified: Secondary | ICD-10-CM | POA: Insufficient documentation

## 2015-10-27 DIAGNOSIS — Z79899 Other long term (current) drug therapy: Secondary | ICD-10-CM | POA: Insufficient documentation

## 2015-10-27 DIAGNOSIS — S93439A Sprain of tibiofibular ligament of unspecified ankle, initial encounter: Secondary | ICD-10-CM

## 2015-10-27 DIAGNOSIS — M24872 Other specific joint derangements of left ankle, not elsewhere classified: Secondary | ICD-10-CM | POA: Insufficient documentation

## 2015-10-27 DIAGNOSIS — S82852P Displaced trimalleolar fracture of left lower leg, subsequent encounter for closed fracture with malunion: Secondary | ICD-10-CM | POA: Diagnosis present

## 2015-10-27 DIAGNOSIS — Z419 Encounter for procedure for purposes other than remedying health state, unspecified: Secondary | ICD-10-CM

## 2015-10-27 DIAGNOSIS — E039 Hypothyroidism, unspecified: Secondary | ICD-10-CM | POA: Insufficient documentation

## 2015-10-27 DIAGNOSIS — X58XXXD Exposure to other specified factors, subsequent encounter: Secondary | ICD-10-CM | POA: Insufficient documentation

## 2015-10-27 HISTORY — PX: ORIF ANKLE FRACTURE: SHX5408

## 2015-10-27 LAB — URINALYSIS, ROUTINE W REFLEX MICROSCOPIC
BILIRUBIN URINE: NEGATIVE
GLUCOSE, UA: NEGATIVE mg/dL
KETONES UR: NEGATIVE mg/dL
Leukocytes, UA: NEGATIVE
Nitrite: NEGATIVE
PH: 5.5 (ref 5.0–8.0)
Protein, ur: NEGATIVE mg/dL
Specific Gravity, Urine: 1.023 (ref 1.005–1.030)

## 2015-10-27 LAB — COMPREHENSIVE METABOLIC PANEL
ALBUMIN: 3.7 g/dL (ref 3.5–5.0)
ALT: 16 U/L — ABNORMAL LOW (ref 17–63)
ANION GAP: 10 (ref 5–15)
AST: 17 U/L (ref 15–41)
Alkaline Phosphatase: 97 U/L (ref 38–126)
BUN: 12 mg/dL (ref 6–20)
CALCIUM: 8.9 mg/dL (ref 8.9–10.3)
CHLORIDE: 107 mmol/L (ref 101–111)
CO2: 23 mmol/L (ref 22–32)
Creatinine, Ser: 1.01 mg/dL (ref 0.61–1.24)
GFR calc non Af Amer: >60 mL/min (ref >60)
GLUCOSE: 113 mg/dL — AB (ref 65–99)
POTASSIUM: 3.8 mmol/L (ref 3.5–5.1)
SODIUM: 140 mmol/L (ref 135–145)
Total Bilirubin: 0.5 mg/dL (ref 0.3–1.2)
Total Protein: 6.9 g/dL (ref 6.5–8.1)

## 2015-10-27 LAB — CBC WITH DIFFERENTIAL/PLATELET
BASOS PCT: 1 %
Basophils Absolute: 0 10*3/uL (ref 0.0–0.1)
EOS ABS: 0.1 10*3/uL (ref 0.0–0.7)
Eosinophils Relative: 3 %
HCT: 41.7 % (ref 39.0–52.0)
HEMOGLOBIN: 14 g/dL (ref 13.0–17.0)
LYMPHS ABS: 1.4 10*3/uL (ref 0.7–4.0)
Lymphocytes Relative: 27 %
MCH: 28.5 pg (ref 26.0–34.0)
MCHC: 33.6 g/dL (ref 30.0–36.0)
MCV: 84.9 fL (ref 78.0–100.0)
MONOS PCT: 11 %
Monocytes Absolute: 0.6 10*3/uL (ref 0.1–1.0)
NEUTROS PCT: 58 %
Neutro Abs: 3.2 10*3/uL (ref 1.7–7.7)
PLATELETS: 172 10*3/uL (ref 150–400)
RBC: 4.91 MIL/uL (ref 4.22–5.81)
RDW: 13.2 % (ref 11.5–15.5)
WBC: 5.4 10*3/uL (ref 4.0–10.5)

## 2015-10-27 LAB — SURGICAL PCR SCREEN
MRSA, PCR: NEGATIVE
Staphylococcus aureus: NEGATIVE

## 2015-10-27 LAB — URINE MICROSCOPIC-ADD ON

## 2015-10-27 LAB — PROTIME-INR
INR: 0.98 (ref 0.00–1.49)
PROTHROMBIN TIME: 13.2 s (ref 11.6–15.2)

## 2015-10-27 LAB — APTT: aPTT: 28 seconds (ref 24–37)

## 2015-10-27 SURGERY — OPEN REDUCTION INTERNAL FIXATION (ORIF) ANKLE FRACTURE
Anesthesia: Regional | Laterality: Left

## 2015-10-27 MED ORDER — LIDOCAINE HCL (CARDIAC) 20 MG/ML IV SOLN
INTRAVENOUS | Status: DC | PRN
  Administered 2015-10-27: 70 mg via INTRAVENOUS

## 2015-10-27 MED ORDER — FENTANYL CITRATE (PF) 100 MCG/2ML IJ SOLN
INTRAMUSCULAR | Status: AC
  Filled 2015-10-27: qty 2

## 2015-10-27 MED ORDER — LIDOCAINE HCL 2 % EX GEL
CUTANEOUS | Status: AC
  Filled 2015-10-27: qty 20

## 2015-10-27 MED ORDER — GLYCOPYRROLATE 0.2 MG/ML IJ SOLN
INTRAMUSCULAR | Status: AC
  Filled 2015-10-27: qty 2

## 2015-10-27 MED ORDER — OXYCODONE-ACETAMINOPHEN 5-325 MG PO TABS: 1.0000 | ORAL_TABLET | Freq: Four times a day (QID) | ORAL | Status: DC | PRN

## 2015-10-27 MED ORDER — OXYCODONE HCL 5 MG PO TABS: 5.0000 mg | ORAL_TABLET | Freq: Four times a day (QID) | ORAL | Status: AC | PRN

## 2015-10-27 MED ORDER — MIDAZOLAM HCL 5 MG/5ML IJ SOLN
INTRAMUSCULAR | Status: DC | PRN
  Administered 2015-10-27: 2 mg via INTRAVENOUS

## 2015-10-27 MED ORDER — NEOSTIGMINE METHYLSULFATE 10 MG/10ML IV SOLN
INTRAVENOUS | Status: AC
  Filled 2015-10-27: qty 1

## 2015-10-27 MED ORDER — PROPOFOL 10 MG/ML IV BOLUS
INTRAVENOUS | Status: AC
  Filled 2015-10-27: qty 20

## 2015-10-27 MED ORDER — PROMETHAZINE HCL 12.5 MG PO TABS: 12.5000 mg | ORAL_TABLET | Freq: Four times a day (QID) | ORAL | Status: AC | PRN

## 2015-10-27 MED ORDER — FENTANYL CITRATE (PF) 250 MCG/5ML IJ SOLN
INTRAMUSCULAR | Status: AC
  Filled 2015-10-27: qty 5

## 2015-10-27 MED ORDER — SODIUM CHLORIDE 0.9 % IJ SOLN
INTRAMUSCULAR | Status: AC
  Filled 2015-10-27: qty 10

## 2015-10-27 MED ORDER — SUCCINYLCHOLINE CHLORIDE 20 MG/ML IJ SOLN
INTRAMUSCULAR | Status: AC
  Filled 2015-10-27: qty 1

## 2015-10-27 MED ORDER — METHOCARBAMOL 500 MG PO TABS: 500.0000 mg | ORAL_TABLET | Freq: Four times a day (QID) | ORAL | Status: AC

## 2015-10-27 MED ORDER — ONDANSETRON HCL 4 MG/2ML IJ SOLN
INTRAMUSCULAR | Status: DC | PRN
  Administered 2015-10-27: 4 mg via INTRAVENOUS

## 2015-10-27 MED ORDER — ROCURONIUM BROMIDE 50 MG/5ML IV SOLN
INTRAVENOUS | Status: AC
  Filled 2015-10-27: qty 1

## 2015-10-27 MED ORDER — ROCURONIUM BROMIDE 100 MG/10ML IV SOLN
INTRAVENOUS | Status: DC | PRN
  Administered 2015-10-27: 50 mg via INTRAVENOUS

## 2015-10-27 MED ORDER — MIDAZOLAM HCL 2 MG/2ML IJ SOLN
INTRAMUSCULAR | Status: AC
  Filled 2015-10-27: qty 2

## 2015-10-27 MED ORDER — EPHEDRINE SULFATE 50 MG/ML IJ SOLN
INTRAMUSCULAR | Status: AC
  Filled 2015-10-27: qty 1

## 2015-10-27 MED ORDER — FENTANYL CITRATE (PF) 100 MCG/2ML IJ SOLN
INTRAMUSCULAR | Status: DC | PRN
  Administered 2015-10-27: 100 ug via INTRAVENOUS
  Administered 2015-10-27: 50 ug via INTRAVENOUS
  Administered 2015-10-27: 100 ug via INTRAVENOUS
  Administered 2015-10-27 (×2): 50 ug via INTRAVENOUS

## 2015-10-27 MED ORDER — PROPOFOL 10 MG/ML IV BOLUS
INTRAVENOUS | Status: DC | PRN
  Administered 2015-10-27: 200 mg via INTRAVENOUS

## 2015-10-27 MED ORDER — NEOSTIGMINE METHYLSULFATE 10 MG/10ML IV SOLN
INTRAVENOUS | Status: DC | PRN
  Administered 2015-10-27: 3 mg via INTRAVENOUS

## 2015-10-27 MED ORDER — BUPIVACAINE-EPINEPHRINE (PF) 0.5% -1:200000 IJ SOLN
INTRAMUSCULAR | Status: DC | PRN
  Administered 2015-10-27: 20 mL via PERINEURAL
  Administered 2015-10-27: 25 mL via PERINEURAL

## 2015-10-27 MED ORDER — ONDANSETRON HCL 4 MG/2ML IJ SOLN
INTRAMUSCULAR | Status: AC
  Filled 2015-10-27: qty 2

## 2015-10-27 MED ORDER — SUCCINYLCHOLINE CHLORIDE 20 MG/ML IJ SOLN
INTRAMUSCULAR | Status: DC | PRN
  Administered 2015-10-27: 120 mg via INTRAVENOUS

## 2015-10-27 MED ORDER — BUPIVACAINE HCL (PF) 0.25 % IJ SOLN
INTRAMUSCULAR | Status: AC
  Filled 2015-10-27: qty 30

## 2015-10-27 MED ORDER — PHENYLEPHRINE 40 MCG/ML (10ML) SYRINGE FOR IV PUSH (FOR BLOOD PRESSURE SUPPORT)
PREFILLED_SYRINGE | INTRAVENOUS | Status: AC
  Filled 2015-10-27: qty 10

## 2015-10-27 MED ORDER — LIDOCAINE HCL (CARDIAC) 20 MG/ML IV SOLN
INTRAVENOUS | Status: AC
  Filled 2015-10-27: qty 5

## 2015-10-27 MED ORDER — HYDROMORPHONE HCL 1 MG/ML IJ SOLN: 0.2500 mg | INTRAMUSCULAR | Status: DC | PRN

## 2015-10-27 MED ORDER — 0.9 % SODIUM CHLORIDE (POUR BTL) OPTIME
TOPICAL | Status: DC | PRN
Start: 2015-10-27 — End: 2015-10-27
  Administered 2015-10-27: 1000 mL

## 2015-10-27 MED ORDER — LACTATED RINGERS IV SOLN
INTRAVENOUS | Status: DC
  Administered 2015-10-27 (×3): via INTRAVENOUS

## 2015-10-27 MED ORDER — DEXAMETHASONE SODIUM PHOSPHATE 10 MG/ML IJ SOLN
INTRAMUSCULAR | Status: DC | PRN
  Administered 2015-10-27: 10 mg via INTRAVENOUS

## 2015-10-27 MED ORDER — EPHEDRINE SULFATE 50 MG/ML IJ SOLN
INTRAMUSCULAR | Status: DC | PRN
  Administered 2015-10-27: 10 mg via INTRAVENOUS

## 2015-10-27 MED ORDER — PROMETHAZINE HCL 25 MG/ML IJ SOLN: 6.2500 mg | INTRAMUSCULAR | Status: DC | PRN

## 2015-10-27 MED ORDER — GLYCOPYRROLATE 0.2 MG/ML IJ SOLN
INTRAMUSCULAR | Status: DC | PRN
  Administered 2015-10-27: 0.4 mg via INTRAVENOUS

## 2015-10-27 MED ORDER — DEXAMETHASONE SODIUM PHOSPHATE 10 MG/ML IJ SOLN
INTRAMUSCULAR | Status: AC
  Filled 2015-10-27: qty 1

## 2015-10-27 SURGICAL SUPPLY — 78 items
ANKLE SYNDEMOSIS ZIPTIGHT (Ankle) ×3 IMPLANT
BANDAGE ACE 4X5 VEL STRL LF (GAUZE/BANDAGES/DRESSINGS) ×3 IMPLANT
BANDAGE ELASTIC 4 VELCRO ST LF (GAUZE/BANDAGES/DRESSINGS) IMPLANT
BANDAGE ELASTIC 6 VELCRO ST LF (GAUZE/BANDAGES/DRESSINGS) IMPLANT
BANDAGE ESMARK 6X9 LF (GAUZE/BANDAGES/DRESSINGS) ×1 IMPLANT
BIT DRILL 2 QR W/DEPTH MARKS (BIT) ×3
BIT DRILL 2.8 QR W/DEPTH MARKS (BIT) ×3 IMPLANT
BIT DRILL 2MM QR W/DEPTH MARKS (BIT) ×1 IMPLANT
BLADE AVERAGE 25MMX9MM (BLADE) ×1
BLADE AVERAGE 25X9 (BLADE) ×2 IMPLANT
BLADE SURG 10 STRL SS (BLADE) ×3 IMPLANT
BNDG COHESIVE 4X5 TAN STRL (GAUZE/BANDAGES/DRESSINGS) ×3 IMPLANT
BNDG ESMARK 6X9 LF (GAUZE/BANDAGES/DRESSINGS) ×3
BNDG GAUZE ELAST 4 BULKY (GAUZE/BANDAGES/DRESSINGS) ×6 IMPLANT
BRUSH SCRUB DISP (MISCELLANEOUS) ×6 IMPLANT
COVER SURGICAL LIGHT HANDLE (MISCELLANEOUS) ×3 IMPLANT
DRAPE C-ARM 42X72 X-RAY (DRAPES) IMPLANT
DRAPE C-ARMOR (DRAPES) ×6 IMPLANT
DRAPE ORTHO SPLIT 77X108 STRL (DRAPES) ×4
DRAPE PROXIMA HALF (DRAPES) ×3 IMPLANT
DRAPE SURG ORHT 6 SPLT 77X108 (DRAPES) ×2 IMPLANT
DRAPE U-SHAPE 47X51 STRL (DRAPES) ×3 IMPLANT
DRESSING ADAPTIC 1/2  N-ADH (PACKING) ×3 IMPLANT
DRSG EMULSION OIL 3X3 NADH (GAUZE/BANDAGES/DRESSINGS) IMPLANT
ELECT REM PT RETURN 9FT ADLT (ELECTROSURGICAL) ×3
ELECTRODE REM PT RTRN 9FT ADLT (ELECTROSURGICAL) ×1 IMPLANT
GAUZE SPONGE 4X4 12PLY STRL (GAUZE/BANDAGES/DRESSINGS) IMPLANT
GLOVE BIO SURGEON STRL SZ7.5 (GLOVE) ×3 IMPLANT
GLOVE BIO SURGEON STRL SZ8 (GLOVE) ×3 IMPLANT
GLOVE BIOGEL PI IND STRL 7.5 (GLOVE) ×1 IMPLANT
GLOVE BIOGEL PI IND STRL 8 (GLOVE) ×1 IMPLANT
GLOVE BIOGEL PI INDICATOR 7.5 (GLOVE) ×2
GLOVE BIOGEL PI INDICATOR 8 (GLOVE) ×2
GOWN STRL REUS W/ TWL LRG LVL3 (GOWN DISPOSABLE) ×2 IMPLANT
GOWN STRL REUS W/ TWL XL LVL3 (GOWN DISPOSABLE) ×1 IMPLANT
GOWN STRL REUS W/TWL LRG LVL3 (GOWN DISPOSABLE) ×4
GOWN STRL REUS W/TWL XL LVL3 (GOWN DISPOSABLE) ×2
KIT BASIN OR (CUSTOM PROCEDURE TRAY) ×3 IMPLANT
KIT ROOM TURNOVER OR (KITS) ×3 IMPLANT
MANIFOLD NEPTUNE II (INSTRUMENTS) ×3 IMPLANT
NEEDLE HYPO 21X1.5 SAFETY (NEEDLE) IMPLANT
NS IRRIG 1000ML POUR BTL (IV SOLUTION) ×3 IMPLANT
PACK GENERAL/GYN (CUSTOM PROCEDURE TRAY) ×3 IMPLANT
PAD ABD 8X10 STRL (GAUZE/BANDAGES/DRESSINGS) ×3 IMPLANT
PAD ARMBOARD 7.5X6 YLW CONV (MISCELLANEOUS) ×6 IMPLANT
PAD CAST 4YDX4 CTTN HI CHSV (CAST SUPPLIES) IMPLANT
PADDING CAST COTTON 4X4 STRL (CAST SUPPLIES)
PADDING CAST COTTON 6X4 STRL (CAST SUPPLIES) IMPLANT
PENCIL BUTTON HOLSTER BLD 10FT (ELECTRODE) ×3 IMPLANT
PLATE LATERAL FIBULA 5HOLE (Plate) ×3 IMPLANT
SCREW HEX LOCK 2.7X16MM (Screw) ×3 IMPLANT
SCREW HEXALOBE LOCKING 3.5X16M (Screw) ×3 IMPLANT
SCREW HEXALOBE NON-LOCK 3.5X14 (Screw) ×6 IMPLANT
SCREW HEXALOBE NON-LOCK 3.5X16 (Screw) ×3 IMPLANT
SCREW LOCK 18X2.7X HEXALOBE (Screw) ×2 IMPLANT
SCREW LOCKING 2.7X18MM (Screw) ×4 IMPLANT
SCREW NONLOCK 2.7X14 (Screw) ×3 IMPLANT
SCREW NONLOCK 3.5X48MM (Screw) ×3 IMPLANT
SPONGE LAP 18X18 X RAY DECT (DISPOSABLE) ×3 IMPLANT
SPONGE SCRUB IODOPHOR (GAUZE/BANDAGES/DRESSINGS) ×3 IMPLANT
STAPLER VISISTAT 35W (STAPLE) IMPLANT
SUCTION FRAZIER HANDLE 10FR (MISCELLANEOUS) ×2
SUCTION TUBE FRAZIER 10FR DISP (MISCELLANEOUS) ×1 IMPLANT
SUT ETHILON 2 0 FS 18 (SUTURE) ×9 IMPLANT
SUT ETHILON 3 0 PS 1 (SUTURE) ×6 IMPLANT
SUT PDS AB 2-0 CT1 27 (SUTURE) IMPLANT
SUT VIC AB 2-0 CT1 27 (SUTURE) ×4
SUT VIC AB 2-0 CT1 TAPERPNT 27 (SUTURE) ×2 IMPLANT
SUT VIC AB 2-0 CT3 27 (SUTURE) IMPLANT
SYR CONTROL 10ML LL (SYRINGE) ×3 IMPLANT
SYSTEM FIXATN ANKL SYNDESMOSIS (Ankle) ×1 IMPLANT
TOWEL OR 17X24 6PK STRL BLUE (TOWEL DISPOSABLE) ×3 IMPLANT
TOWEL OR 17X26 10 PK STRL BLUE (TOWEL DISPOSABLE) ×6 IMPLANT
TUBE CONNECTING 12'X1/4 (SUCTIONS) ×1
TUBE CONNECTING 12X1/4 (SUCTIONS) ×2 IMPLANT
UNDERPAD 30X30 INCONTINENT (UNDERPADS AND DIAPERS) ×3 IMPLANT
WATER STERILE IRR 1000ML POUR (IV SOLUTION) IMPLANT
WIRE TACK PLATE PL-PTACK (WIRE) ×6 IMPLANT

## 2015-10-27 NOTE — Op Note (Signed)
Jackson Gray, Jackson Gray NO.:  [id number]  MEDICAL RECORD NO.:  [id number]  LOCATION:  [facility]                         FACILITY:  [facility]  PHYSICIAN:  Astrid Divine. Marcelino Scot, M.D. DATE OF BIRTH:  May 11, 1946  DATE OF PROCEDURE:  10/27/2015 DATE OF DISCHARGE:                              OPERATIVE REPORT   PREOPERATIVE DIAGNOSES: 1. Left ankle talar subluxation. 2. Left ankle trimalleolar malunion.  POSTOPERATIVE DIAGNOSES: 1. Left ankle talar subluxation. 2. Left ankle trimalleolar malunion.  PROCEDURES: 1. Left fibular osteotomy. 2. Left lateral malleolus ORIF. 3. Left syndesmosis repair. 4. Arthrotomy and debridement of the left ankle joint.  SURGEON:  Astrid Divine. Marcelino Scot, M.D.  ASSISTANT:  Ainsley Spinner, PA-C.  ANESTHESIA:  General and regional blocks.  COMPLICATIONS:  None.  TOURNIQUET:  81 minutes.  DISPOSITION:  To PACU.  CONDITION:  Stable.  BRIEF SUMMARY OF INDICATION FOR PROCEDURE:  Jackson Gray is a very pleasant 70 year old active male who sustained a trimalleolar ankle fracture dislocation, treated with external fixation.  Prolonged observation was required because of persistent soft tissue swelling that precluded operative repair.  He went on to unite with his talus in a reduced position, however, after weightbearing was noted to have some lateral talar subluxation with persistent limp.  Consequently at that time, I discussed with him the risks and benefits of repair of the malunion using a fibular osteotomy with open debridement of the joint, direct syndesmotic reduction, repair of the fibular fracture, and nonweightbearing for 8 weeks.  Risks discussed included, heart attack, stroke, infection, nerve injury, vessel injury, loss of motion, need for further surgery, DVT, PE, and multiple others.  The patient acknowledged all these risks and others and did wish to proceed.  BRIEF SUMMARY OF PROCEDURE:  Jackson Gray was taken to operating room where  general anesthesia was induced.  He was positioned supine with a bump under his left hip.  Standard chlorhexidine and then Betadine scrub and paint was performed in left lower extremity following prep and drape.  Time-out was held and then a curvilinear medial incision and arthrotomy was performed.  I did protect the saphenous vein retracting it anteriorly.  I excised a large block of soft tissue that accumulated in the medial gutter and I was able to confirm this under direct visualization.  It was excised sharply with a 15 blade and removed with a rongeur and a clamp avoiding injury to the weightbearing articular surface.  It was irrigated thoroughly.  Attention was then turned laterally.  An incision over the fibula slightly anterior was then made and carried dissection down to the periosteum which was left intact over the fibula. It went anteriorly into the joint and syndesmosis, identified the old fracture line.  I then took an osteotome and using C-arm for guidance, was able to make an osteotomy of the fibula matching the old fracture in large part and did make an oblique 1 to maximize the bony surface for subsequent healing.  I then used this interval to further expose and define the syndesmosis clearing it of all soft tissue debris and using both knife as well as curettage.  Again, we were careful to avoid injury to the  weightbearing surface of the talus or the plafond.  I then affixed the Acumed plate to the lateral malleolus checking its position on the distal fibula and shaft.  I then secured fixation in the distal block, removed the provisional pin in the proximal shaft and then translated it distally and medially using a Erma Pinto clamp placed on the distal metaphysis through the medial incision.  Multiple C-arm images were obtained to make sure that we had achieved the correct reduction and position.  Once this was done, I then secured it into the fibular shaft proximally.   I placed additional standard screws to further medialize the distal lateral malleolus.  I then placed a syndesmotic screw with anterior orientation to the screw and followed this with a more distal zip tight suture anchor very carefully delivering the medial metal stay around the saphenous vein and orienting it to avoid any pressure impingement on the vein.  The Erma Pinto was then removed, and we had obtained excellent stability and reduction,  It was confirmed on oblique AP and lateral views.  Tourniquet was deflated. Hemostasis obtained with electrocautery.  Wounds were irrigated thoroughly medially and laterally and then a standard layered closure performed using 0 Vicryl and nylon.  Ainsley Spinner, PA-C assisted me throughout with all portions of the procedure including the medial arthrotomy, the fibular osteotomy,  repair of lateral malleolus, and repair of the syndesmosis.  Sterile gently compressive dressing and then posterior and stirrup splint applied.  The patient was awakened from anesthesia and transferred to PACU in stable condition.  PROGNOSIS:  Jackson Gray will be nonweightbearing for the next 8 weeks with graduated weightbearing thereafter in a protective boot.  When he returns to the office in 2 weeks, we do anticipate removal of sutures and initiation of unrestricted range of motion.     Astrid Divine. Marcelino Scot, M.D.     MHH/MEDQ  D:  10/27/2015  T:  10/27/2015  Job:  [id number]

## 2015-10-27 NOTE — Anesthesia Postprocedure Evaluation (Signed)
Anesthesia Post Note  Patient: Jackson Gray  Procedure(s) Performed: Procedure(s) (LRB): LEFT SYNDESMOSIS REPAIR (Left)  Patient location during evaluation: PACU Anesthesia Type: General and Regional Level of consciousness: awake and alert Pain management: satisfactory to patient Vital Signs Assessment: post-procedure vital signs reviewed and stable Respiratory status: spontaneous breathing, nonlabored ventilation, respiratory function stable and patient connected to nasal cannula oxygen Cardiovascular status: blood pressure returned to baseline and stable Postop Assessment: no signs of nausea or vomiting Anesthetic complications: no    Last Vitals:  Filed Vitals:   10/27/15 1127 10/27/15 1143  BP: 119/71 132/77  Pulse: 75 85  Temp:    Resp: 14 21    Last Pain:  Filed Vitals:   10/27/15 1143  PainSc: 1                  Hydee Fleece,JAMES TERRILL

## 2015-10-27 NOTE — Anesthesia Procedure Notes (Addendum)
Anesthesia Regional Block:  Adductor canal block  Pre-Anesthetic Checklist: ,, timeout performed, Correct Patient, Correct Site, Correct Laterality, Correct Procedure, Correct Position, site marked, Risks and benefits discussed,  Surgical consent,  Pre-op evaluation,  At surgeon's request and post-op pain management  Laterality: Left  Prep: chloraprep       Needles:  Injection technique: Single-shot  Needle Type: Echogenic Needle     Needle Length: 9cm 9 cm Needle Gauge: 21 and 21 G    Additional Needles:  Procedures: ultrasound guided (picture in chart) Adductor canal block Narrative:  Start time: 10/27/2015 8:07 AM End time: 10/27/2015 8:15 AM Injection made incrementally with aspirations every 5 mL.  Performed by: Personally  Anesthesiologist: Suzette Battiest   Anesthesia Regional Block:  Popliteal block  Pre-Anesthetic Checklist: ,, timeout performed, Correct Patient, Correct Site, Correct Laterality, Correct Procedure, Correct Position, site marked, Risks and benefits discussed,  Surgical consent,  Pre-op evaluation,  At surgeon's request and post-op pain management  Laterality: Left  Prep: chloraprep       Needles:  Injection technique: Single-shot  Needle Type: Echogenic Stimulator Needle     Needle Length: 9cm 9 cm Needle Gauge: 21 and 21 G    Additional Needles:  Procedures: ultrasound guided (picture in chart) and nerve stimulator Popliteal block  Nerve Stimulator or Paresthesia:  Response: tibial, 0.5 mA,  Response: peroneal, 0.5 mA,   Additional Responses:   Narrative:  Start time: 10/27/2015 8:15 AM End time: 10/27/2015 8:22 AM Injection made incrementally with aspirations every 5 mL.  Performed by: Personally  Anesthesiologist: Suzette Battiest   Procedure Name: Intubation Date/Time: 10/27/2015 8:42 AM Performed by: Rebekah Chesterfield L Pre-anesthesia Checklist: Patient identified, Emergency Drugs available, Suction available,  Patient being monitored and Timeout performed Patient Re-evaluated:Patient Re-evaluated prior to inductionOxygen Delivery Method: Circle system utilized Preoxygenation: Pre-oxygenation with 100% oxygen Intubation Type: IV induction and Cricoid Pressure applied Ventilation: Mask ventilation with difficulty and Oral airway inserted - appropriate to patient size Laryngoscope Size: Mac and 4 Grade View: Grade III Tube type: Oral Tube size: 8.0 mm Number of attempts: 2 Airway Equipment and Method: Stylet Placement Confirmation: ETT inserted through vocal cords under direct vision,  positive ETCO2 and breath sounds checked- equal and bilateral Secured at: 22 cm Tube secured with: Tape Dental Injury: Teeth and Oropharynx as per pre-operative assessment  Difficulty Due To: Difficult Airway- due to anterior larynx and Difficult Airway- due to reduced neck mobility Future Recommendations: Recommend- induction with short-acting agent, and alternative techniques readily available

## 2015-10-27 NOTE — Brief Op Note (Addendum)
10/27/2015  10:40 AM  PATIENT:  Jackson Gray  70 y.o. male  PRE-OPERATIVE DIAGNOSIS:   1. LEFT ANKLE TALAR SUBLUXATION 2. LEFT ANKLE TRIMALLEOLAR MALUNION  POST-OPERATIVE DIAGNOSIS:  1. LEFT ANKLE TALAR SUBLUXATION 2. LEFT ANKLE TRIMALLEOLAR MALUNION  PROCEDURE:  Procedure(s): 1. LEFT FIBULAR OSTEOTOMY 2. LEFT LATERAL MALLEOLUS ORIF 3. LEFT SYNDESMOSIS REPAIR (Left) 4. LEFT ANKLE ARTHROTOMY WITH EXCISIONAL DEBRIDEMENT  SURGEON:  Surgeon(s) and Role:    * Altamese [place], MD - Primary  PHYSICIAN ASSISTANT: Ainsley Spinner, PA-C  ANESTHESIA:   regional and general  I/O:  Total I/O In: 1000 [I.V.:1000] Out: -   SPECIMEN:  No Specimen  TOURNIQUET:   Total Tourniquet Time Documented: Thigh (Left) - 81 minutes Total: Thigh (Left) - 81 minutes   DICTATION: .Other Dictation: Dictation Number [id number]

## 2015-10-27 NOTE — Anesthesia Preprocedure Evaluation (Signed)
Anesthesia Evaluation  Patient identified by MRN, date of birth, ID band Patient awake    Reviewed: Allergy & Precautions, NPO status , Patient's Chart, lab work & pertinent test results  Airway Mallampati: II  TM Distance: >3 FB Neck ROM: Full    Dental   Pulmonary neg sleep apnea, former smoker,    breath sounds clear to auscultation       Cardiovascular negative cardio ROS   Rhythm:Regular Rate:Normal     Neuro/Psych    GI/Hepatic negative GI ROS, Neg liver ROS,   Endo/Other  Hypothyroidism   Renal/GU negative Renal ROS     Musculoskeletal   Abdominal   Peds  Hematology   Anesthesia Other Findings   Reproductive/Obstetrics                             Anesthesia Physical Anesthesia Plan  ASA: I  Anesthesia Plan: General and Regional   Post-op Pain Management:    Induction: Intravenous  Airway Management Planned: Oral ETT  Additional Equipment:   Intra-op Plan:   Post-operative Plan: Extubation in OR  Informed Consent:   Dental advisory given  Plan Discussed with: CRNA and Surgeon  Anesthesia Plan Comments:         Anesthesia Quick Evaluation

## 2015-10-27 NOTE — Transfer of Care (Signed)
Immediate Anesthesia Transfer of Care Note  Patient: Jackson Gray  Procedure(s) Performed: Procedure(s): LEFT SYNDESMOSIS REPAIR (Left)  Patient Location: PACU  Anesthesia Type:General and GA combined with regional for post-op pain  Level of Consciousness: awake, sedated, patient cooperative and responds to stimulation  Airway & Oxygen Therapy: Patient Spontanous Breathing and Patient connected to nasal cannula oxygen  Post-op Assessment: Report given to RN, Post -op Vital signs reviewed and stable and Patient moving all extremities  Post vital signs: Reviewed and stable  Last Vitals:  Filed Vitals:   10/27/15 1112 10/27/15 1113  BP: 111/65   Pulse: 72   Temp:  36.2 C  Resp: 10     Complications: No apparent anesthesia complications

## 2015-10-28 ENCOUNTER — Encounter (HOSPITAL_COMMUNITY): Payer: Self-pay | Admitting: Orthopedic Surgery

## 2015-10-30 ENCOUNTER — Encounter (HOSPITAL_COMMUNITY): Payer: Self-pay | Admitting: Orthopedic Surgery

## 2016-05-11 ENCOUNTER — Encounter (HOSPITAL_COMMUNITY): Payer: Self-pay | Admitting: *Deleted

## 2016-05-11 MED ORDER — CEFAZOLIN SODIUM-DEXTROSE 2-4 GM/100ML-% IV SOLN
2.0000 g | INTRAVENOUS | Status: AC
  Administered 2016-05-12: 2 g via INTRAVENOUS
  Filled 2016-05-11: qty 100

## 2016-05-11 NOTE — Progress Notes (Signed)
Spoke with pt for pre-op call. Pt denies cardiac history, chest pain or sob. 

## 2016-05-11 NOTE — Anesthesia Preprocedure Evaluation (Signed)
Anesthesia Evaluation  Patient identified by MRN, date of birth, ID band Patient awake    Reviewed: Allergy & Precautions, NPO status , Patient's Chart, lab work & pertinent test results  Airway Mallampati: II  TM Distance: >3 FB Neck ROM: Full    Dental   Pulmonary neg sleep apnea, former smoker,    breath sounds clear to auscultation       Cardiovascular negative cardio ROS   Rhythm:Regular Rate:Normal     Neuro/Psych    GI/Hepatic negative GI ROS, Neg liver ROS,   Endo/Other  Hypothyroidism   Renal/GU negative Renal ROS     Musculoskeletal   Abdominal   Peds  Hematology   Anesthesia Other Findings   Reproductive/Obstetrics                             Anesthesia Physical  Anesthesia Plan  ASA: II  Anesthesia Plan: General and Regional   Post-op Pain Management:    Induction: Intravenous  Airway Management Planned: LMA  Additional Equipment:   Intra-op Plan:   Post-operative Plan: Extubation in OR  Informed Consent:   Dental advisory given  Plan Discussed with: CRNA and Surgeon  Anesthesia Plan Comments:         Anesthesia Quick Evaluation

## 2016-05-11 NOTE — H&P (Signed)
Orthopaedic Trauma Service H&P  Chief Complaint:  Symptomatic HW L ankle  HPI:   S/p ORIF L syndesmosis. Syndesmosis has healed. Presents for [facility] L ankle   Past Medical History:  Diagnosis Date  . Arthritis   . Basal cell carcinoma    "forehead; left forearm" (06/17/2015)  . BPH (benign prostatic hyperplasia)   . 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 Hypothyroidism   . Sleep apnea    "probably; never checked for it" (06/17/2015) Pt denies 08/17/15!!!!!    Past Surgical History:  Procedure Laterality Date  . ANKLE CLOSED REDUCTION Left 06/12/2015; 06/15/2015  . APPENDECTOMY  1960's  . BASAL CELL CARCINOMA EXCISION     "forehead; arm"  . CARDIAC CATHETERIZATION  1998  . COLONOSCOPY    . EXTERNAL FIXATION REMOVAL Left 08/18/2015   Procedure: REMOVAL EXTERNAL FIXATION LEFT  LEG;  Surgeon: Altamese [place], MD;  Location: [facility];  Service: Orthopedics;  Laterality: Left;  . ORIF ANKLE FRACTURE Left 06/18/2015   Procedure: EXTERNAL FIXATION (ORIF) LEFT ANKLE FRACTURE;  Surgeon: Altamese [place], MD;  Location: [place];  Service: Orthopedics;  Laterality: Left;  . ORIF ANKLE FRACTURE Left 10/27/2015   Procedure: LEFT SYNDESMOSIS REPAIR;  Surgeon: Altamese [place], MD;  Location: [place];  Service: Orthopedics;  Laterality: Left;  . SHOULDER OPEN ROTATOR CUFF REPAIR Right 2015    History reviewed. No pertinent family history. Social History:  reports that he has quit smoking. His smoking use included Cigarettes. He has a 52.50 pack-year smoking history. He has never used smokeless tobacco. He reports that he drinks alcohol. He reports that he does not use drugs.  Allergies:  Allergies  Allergen Reactions  . Sulfacetamide Sodium Other (See Comments)    UNSPECIFIED  reaction during childhood     No prescriptions prior to admission.    No results found for this or any previous visit (from the past 48 hour(s)). No results found.  Review of Systems  Constitutional:  Negative for chills and fever.  Respiratory: Negative for shortness of breath and wheezing.   Cardiovascular: Negative for chest pain and palpitations.  Gastrointestinal: Negative for abdominal pain, nausea and vomiting.  Neurological: Negative for tingling, sensory change and headaches.    Vitals to be taken on arrival to short stay  Physical Exam  Constitutional: He appears well-developed and well-nourished.  HENT:  Head: Atraumatic.  Cardiovascular: Normal rate and regular rhythm.   Respiratory: Effort normal and breath sounds normal. No respiratory distress.  GI: Soft. Bowel sounds are normal.  Musculoskeletal:  Left ankle   Moderate L ankle swelling   No ankle tenderness   Motor and sensory functions intact  + DP pulse    TTP over HW     Assessment/Plan  70 y/o male s/p repair of L syndesmosis presents for hardware removal  OR for [facility] outpt procedure Risks and benefits reviewed, pt wishes to proceed WBAT post op  Raveen Wieseler W, PA-C 05/11/2016, 4:36 PM

## 2016-05-12 ENCOUNTER — Encounter (HOSPITAL_COMMUNITY): Payer: Self-pay | Admitting: *Deleted

## 2016-05-12 ENCOUNTER — Ambulatory Visit (HOSPITAL_COMMUNITY): Payer: Medicare Other

## 2016-05-12 ENCOUNTER — Ambulatory Visit (HOSPITAL_COMMUNITY): Payer: Medicare Other | Admitting: Anesthesiology

## 2016-05-12 ENCOUNTER — Ambulatory Visit (HOSPITAL_COMMUNITY)
Admission: RE | Admit: 2016-05-12 | Discharge: 2016-05-12 | Disposition: A | Discharge status: Home / Self Care | Payer: Medicare Other | Source: Ambulatory Visit | Attending: Orthopedic Surgery | Admitting: Orthopedic Surgery

## 2016-05-12 ENCOUNTER — Encounter (HOSPITAL_COMMUNITY)
Admission: RE | Disposition: A | Discharge status: Home / Self Care | Payer: Self-pay | Source: Ambulatory Visit | Attending: Orthopedic Surgery

## 2016-05-12 DIAGNOSIS — E039 Hypothyroidism, unspecified: Secondary | ICD-10-CM | POA: Diagnosis not present

## 2016-05-12 DIAGNOSIS — T84127A Displacement of internal fixation device of bone of left lower leg, initial encounter: Secondary | ICD-10-CM | POA: Insufficient documentation

## 2016-05-12 DIAGNOSIS — Z87891 Personal history of nicotine dependence: Secondary | ICD-10-CM | POA: Insufficient documentation

## 2016-05-12 DIAGNOSIS — Z79899 Other long term (current) drug therapy: Secondary | ICD-10-CM | POA: Diagnosis not present

## 2016-05-12 DIAGNOSIS — Z85828 Personal history of other malignant neoplasm of skin: Secondary | ICD-10-CM | POA: Insufficient documentation

## 2016-05-12 DIAGNOSIS — Y831 Surgical operation with implant of artificial internal device as the cause of abnormal reaction of the patient, or of later complication, without mention of misadventure at the time of the procedure: Secondary | ICD-10-CM | POA: Diagnosis not present

## 2016-05-12 DIAGNOSIS — Z419 Encounter for procedure for purposes other than remedying health state, unspecified: Secondary | ICD-10-CM

## 2016-05-12 DIAGNOSIS — T849XXA Unspecified complication of internal orthopedic prosthetic device, implant and graft, initial encounter: Secondary | ICD-10-CM | POA: Diagnosis present

## 2016-05-12 HISTORY — DX: Unspecified osteoarthritis, unspecified site: M19.90

## 2016-05-12 HISTORY — PX: HARDWARE REMOVAL: SHX979

## 2016-05-12 LAB — CBC
HCT: 41.2 % (ref 39.0–52.0)
Hemoglobin: 13.7 g/dL (ref 13.0–17.0)
MCH: 28.5 pg (ref 26.0–34.0)
MCHC: 33.3 g/dL (ref 30.0–36.0)
MCV: 85.8 fL (ref 78.0–100.0)
PLATELETS: 171 10*3/uL (ref 150–400)
RBC: 4.8 MIL/uL (ref 4.22–5.81)
RDW: 13.7 % (ref 11.5–15.5)
WBC: 6.8 10*3/uL (ref 4.0–10.5)

## 2016-05-12 SURGERY — REMOVAL, HARDWARE
Anesthesia: Regional | Site: Ankle | Laterality: Left

## 2016-05-12 MED ORDER — MIDAZOLAM HCL 2 MG/2ML IJ SOLN
INTRAMUSCULAR | Status: AC
  Filled 2016-05-12: qty 2

## 2016-05-12 MED ORDER — OXYCODONE-ACETAMINOPHEN 5-325 MG PO TABS: 1.0000 | ORAL_TABLET | Freq: Two times a day (BID) | ORAL | 0 refills | Status: AC | PRN

## 2016-05-12 MED ORDER — MIDAZOLAM HCL 5 MG/5ML IJ SOLN
INTRAMUSCULAR | Status: DC | PRN
  Administered 2016-05-12: 2 mg via INTRAVENOUS

## 2016-05-12 MED ORDER — DEXAMETHASONE SODIUM PHOSPHATE 10 MG/ML IJ SOLN
INTRAMUSCULAR | Status: AC
  Filled 2016-05-12: qty 1

## 2016-05-12 MED ORDER — HYDROMORPHONE HCL 1 MG/ML IJ SOLN: 0.2500 mg | INTRAMUSCULAR | Status: DC | PRN

## 2016-05-12 MED ORDER — LIDOCAINE 2% (20 MG/ML) 5 ML SYRINGE
INTRAMUSCULAR | Status: AC
  Filled 2016-05-12: qty 5

## 2016-05-12 MED ORDER — GLYCOPYRROLATE 0.2 MG/ML IJ SOLN
INTRAMUSCULAR | Status: DC | PRN
  Administered 2016-05-12: .3 mg via INTRAVENOUS

## 2016-05-12 MED ORDER — CHLORHEXIDINE GLUCONATE 4 % EX LIQD: 60.0000 mL | Freq: Once | CUTANEOUS | Status: DC

## 2016-05-12 MED ORDER — PROMETHAZINE HCL 25 MG/ML IJ SOLN: 6.2500 mg | INTRAMUSCULAR | Status: DC | PRN

## 2016-05-12 MED ORDER — PROPOFOL 10 MG/ML IV BOLUS
INTRAVENOUS | Status: AC
  Filled 2016-05-12: qty 20

## 2016-05-12 MED ORDER — FENTANYL CITRATE (PF) 100 MCG/2ML IJ SOLN
INTRAMUSCULAR | Status: DC | PRN
  Administered 2016-05-12: 100 ug via INTRAVENOUS

## 2016-05-12 MED ORDER — BUPIVACAINE HCL (PF) 0.25 % IJ SOLN
INTRAMUSCULAR | Status: AC
  Filled 2016-05-12: qty 30

## 2016-05-12 MED ORDER — SODIUM CHLORIDE 0.9 % IR SOLN
Status: DC | PRN
  Administered 2016-05-12: 1000 mL

## 2016-05-12 MED ORDER — LIDOCAINE HCL (CARDIAC) 20 MG/ML IV SOLN
INTRAVENOUS | Status: DC | PRN
  Administered 2016-05-12: 60 mg via INTRAVENOUS

## 2016-05-12 MED ORDER — ONDANSETRON HCL 4 MG/2ML IJ SOLN
INTRAMUSCULAR | Status: DC | PRN
  Administered 2016-05-12: 4 mg via INTRAVENOUS

## 2016-05-12 MED ORDER — PROPOFOL 10 MG/ML IV BOLUS
INTRAVENOUS | Status: DC | PRN
  Administered 2016-05-12: 140 mg via INTRAVENOUS

## 2016-05-12 MED ORDER — DEXAMETHASONE SODIUM PHOSPHATE 10 MG/ML IJ SOLN
INTRAMUSCULAR | Status: DC | PRN
  Administered 2016-05-12: 10 mg via INTRAVENOUS

## 2016-05-12 MED ORDER — FENTANYL CITRATE (PF) 100 MCG/2ML IJ SOLN
INTRAMUSCULAR | Status: AC
  Filled 2016-05-12: qty 2

## 2016-05-12 MED ORDER — BUPIVACAINE-EPINEPHRINE 0.5% -1:200000 IJ SOLN
INTRAMUSCULAR | Status: DC | PRN
  Administered 2016-05-12: 7 mL

## 2016-05-12 MED ORDER — LACTATED RINGERS IV SOLN
INTRAVENOUS | Status: DC | PRN
  Administered 2016-05-12: 07:00:00 via INTRAVENOUS

## 2016-05-12 MED ORDER — GLYCOPYRROLATE 0.2 MG/ML IV SOSY
PREFILLED_SYRINGE | INTRAVENOUS | Status: AC
  Filled 2016-05-12: qty 3

## 2016-05-12 MED ORDER — ONDANSETRON HCL 4 MG/2ML IJ SOLN
INTRAMUSCULAR | Status: AC
  Filled 2016-05-12: qty 2

## 2016-05-12 MED ORDER — MEPERIDINE HCL 25 MG/ML IJ SOLN: 6.2500 mg | INTRAMUSCULAR | Status: DC | PRN

## 2016-05-12 MED ORDER — BUPIVACAINE HCL (PF) 0.5 % IJ SOLN
INTRAMUSCULAR | Status: AC
  Filled 2016-05-12: qty 30

## 2016-05-12 MED ORDER — KETOROLAC TROMETHAMINE 10 MG PO TABS: 10.0000 mg | ORAL_TABLET | Freq: Four times a day (QID) | ORAL | 0 refills | Status: AC | PRN

## 2016-05-12 SURGICAL SUPPLY — 63 items
BANDAGE ACE 4X5 VEL STRL LF (GAUZE/BANDAGES/DRESSINGS) ×3 IMPLANT
BANDAGE ELASTIC 4 VELCRO ST LF (GAUZE/BANDAGES/DRESSINGS) ×3 IMPLANT
BANDAGE ELASTIC 6 VELCRO ST LF (GAUZE/BANDAGES/DRESSINGS) ×3 IMPLANT
BANDAGE ESMARK 6X9 LF (GAUZE/BANDAGES/DRESSINGS) ×1 IMPLANT
BNDG COHESIVE 6X5 TAN STRL LF (GAUZE/BANDAGES/DRESSINGS) ×3 IMPLANT
BNDG ESMARK 6X9 LF (GAUZE/BANDAGES/DRESSINGS) ×3
BNDG GAUZE ELAST 4 BULKY (GAUZE/BANDAGES/DRESSINGS) ×3 IMPLANT
BRUSH SCRUB DISP (MISCELLANEOUS) ×6 IMPLANT
CLEANER TIP ELECTROSURG 2X2 (MISCELLANEOUS) ×3 IMPLANT
CLOSURE WOUND 1/2 X4 (GAUZE/BANDAGES/DRESSINGS)
COVER SURGICAL LIGHT HANDLE (MISCELLANEOUS) ×6 IMPLANT
CUFF TOURNIQUET SINGLE 18IN (TOURNIQUET CUFF) IMPLANT
CUFF TOURNIQUET SINGLE 24IN (TOURNIQUET CUFF) IMPLANT
CUFF TOURNIQUET SINGLE 34IN LL (TOURNIQUET CUFF) IMPLANT
DRAPE C-ARM 42X72 X-RAY (DRAPES) IMPLANT
DRAPE C-ARMOR (DRAPES) ×3 IMPLANT
DRAPE OEC MINIVIEW 54X84 (DRAPES) ×3 IMPLANT
DRAPE U-SHAPE 47X51 STRL (DRAPES) ×3 IMPLANT
DRSG ADAPTIC 3X8 NADH LF (GAUZE/BANDAGES/DRESSINGS) ×3 IMPLANT
ELECT REM PT RETURN 9FT ADLT (ELECTROSURGICAL) ×3
ELECTRODE REM PT RTRN 9FT ADLT (ELECTROSURGICAL) ×1 IMPLANT
EVACUATOR 1/8 PVC DRAIN (DRAIN) IMPLANT
GAUZE SPONGE 4X4 12PLY STRL (GAUZE/BANDAGES/DRESSINGS) ×3 IMPLANT
GLOVE BIO SURGEON STRL SZ7.5 (GLOVE) ×3 IMPLANT
GLOVE BIO SURGEON STRL SZ8 (GLOVE) ×3 IMPLANT
GLOVE BIOGEL PI IND STRL 7.5 (GLOVE) ×1 IMPLANT
GLOVE BIOGEL PI IND STRL 8 (GLOVE) ×1 IMPLANT
GLOVE BIOGEL PI INDICATOR 7.5 (GLOVE) ×2
GLOVE BIOGEL PI INDICATOR 8 (GLOVE) ×2
GOWN STRL REUS W/ TWL LRG LVL3 (GOWN DISPOSABLE) ×2 IMPLANT
GOWN STRL REUS W/ TWL XL LVL3 (GOWN DISPOSABLE) ×1 IMPLANT
GOWN STRL REUS W/TWL LRG LVL3 (GOWN DISPOSABLE) ×4
GOWN STRL REUS W/TWL XL LVL3 (GOWN DISPOSABLE) ×2
KIT BASIN OR (CUSTOM PROCEDURE TRAY) ×3 IMPLANT
KIT ROOM TURNOVER OR (KITS) ×3 IMPLANT
MANIFOLD NEPTUNE II (INSTRUMENTS) ×3 IMPLANT
NEEDLE 22X1 1/2 (OR ONLY) (NEEDLE) IMPLANT
NS IRRIG 1000ML POUR BTL (IV SOLUTION) ×3 IMPLANT
PACK ORTHO EXTREMITY (CUSTOM PROCEDURE TRAY) ×3 IMPLANT
PAD ARMBOARD 7.5X6 YLW CONV (MISCELLANEOUS) ×6 IMPLANT
PADDING CAST COTTON 6X4 STRL (CAST SUPPLIES) ×9 IMPLANT
SPONGE GAUZE 4X4 12PLY STER LF (GAUZE/BANDAGES/DRESSINGS) ×3 IMPLANT
SPONGE LAP 18X18 X RAY DECT (DISPOSABLE) ×3 IMPLANT
SPONGE SCRUB IODOPHOR (GAUZE/BANDAGES/DRESSINGS) ×3 IMPLANT
STAPLER VISISTAT 35W (STAPLE) IMPLANT
STOCKINETTE IMPERVIOUS LG (DRAPES) ×3 IMPLANT
STRIP CLOSURE SKIN 1/2X4 (GAUZE/BANDAGES/DRESSINGS) IMPLANT
SUCTION FRAZIER HANDLE 10FR (MISCELLANEOUS)
SUCTION TUBE FRAZIER 10FR DISP (MISCELLANEOUS) IMPLANT
SUT ETHILON 3 0 PS 1 (SUTURE) IMPLANT
SUT PDS AB 2-0 CT1 27 (SUTURE) IMPLANT
SUT VIC AB 0 CT1 27 (SUTURE)
SUT VIC AB 0 CT1 27XBRD ANBCTR (SUTURE) IMPLANT
SUT VIC AB 2-0 CT1 27 (SUTURE)
SUT VIC AB 2-0 CT1 TAPERPNT 27 (SUTURE) IMPLANT
SYR CONTROL 10ML LL (SYRINGE) IMPLANT
TOWEL OR 17X24 6PK STRL BLUE (TOWEL DISPOSABLE) ×6 IMPLANT
TOWEL OR 17X26 10 PK STRL BLUE (TOWEL DISPOSABLE) ×6 IMPLANT
TUBE CONNECTING 12'X1/4 (SUCTIONS) ×1
TUBE CONNECTING 12X1/4 (SUCTIONS) ×2 IMPLANT
UNDERPAD 30X30 INCONTINENT (UNDERPADS AND DIAPERS) ×3 IMPLANT
WATER STERILE IRR 1000ML POUR (IV SOLUTION) ×6 IMPLANT
YANKAUER SUCT BULB TIP NO VENT (SUCTIONS) ×3 IMPLANT

## 2016-05-12 NOTE — Anesthesia Procedure Notes (Signed)
Procedure Name: LMA Insertion Date/Time: 05/12/2016 8:16 AM Performed by: Huey Romans ANN Pre-anesthesia Checklist: Patient identified, Emergency Drugs available, Suction available and Patient being monitored Patient Re-evaluated:Patient Re-evaluated prior to inductionOxygen Delivery Method: Circle System Utilized Preoxygenation: Pre-oxygenation with 100% oxygen Intubation Type: IV induction Ventilation: Mask ventilation without difficulty LMA: LMA inserted LMA Size: 5.0 Number of attempts: 1 Airway Equipment and Method: Bite block Placement Confirmation: positive ETCO2 Tube secured with: Tape Dental Injury: Teeth and Oropharynx as per pre-operative assessment

## 2016-05-12 NOTE — Transfer of Care (Signed)
Immediate Anesthesia Transfer of Care Note  Patient: BRAVE FORGEY  Procedure(s) Performed: Procedure(s): HARDWARE REMOVAL LEFT ANKLE (Left)  Patient Location: PACU  Anesthesia Type:General  Level of Consciousness: awake, alert  and oriented  Airway & Oxygen Therapy: Patient Spontanous Breathing and Patient connected to face mask oxygen  Post-op Assessment: Report given to RN and Post -op Vital signs reviewed and stable  Post vital signs: Reviewed and stable  Last Vitals:  Vitals:   05/12/16 0627  BP: (!) 149/90  Pulse: 60  Resp: 20  Temp: 36.4 C    Last Pain:  Vitals:   05/12/16 0627  TempSrc: Oral      Patients Stated Pain Goal: 5 ([id number])  Complications: No apparent anesthesia complications

## 2016-05-12 NOTE — Brief Op Note (Signed)
05/12/2016  9:16 AM  PATIENT:  Robby Sermon  70 y.o. male  PRE-OPERATIVE DIAGNOSIS:  symptomatic hardware left ankle  POST-OPERATIVE DIAGNOSIS:  symptomatic hardware left ankle  PROCEDURE:  Procedure(s): HARDWARE REMOVAL LEFT ANKLE (Left)  Stress flouro of syndesmosis  SURGEON:  Surgeon(s) and Role:    * Altamese , MD - Primary  PHYSICIAN ASSISTANT: None  ANESTHESIA:   local and general  EBL:  Total I/O In: 700 [I.V.:700] Out: 5 [Blood:5]  BLOOD ADMINISTERED:none  DRAINS: none   LOCAL MEDICATIONS USED:  MARCAINE     SPECIMEN:  No Specimen  DISPOSITION OF SPECIMEN:  N/A  COUNTS:  YES  TOURNIQUET:  * No tourniquets in log *  DICTATION: .Other Dictation: Dictation Number [id number]  PLAN OF CARE: Discharge to home after PACU  PATIENT DISPOSITION:  PACU - hemodynamically stable.   Delay start of Pharmacological VTE agent (>24hrs) due to surgical blood loss or risk of bleeding: no

## 2016-05-12 NOTE — Op Note (Signed)
NAM[place]OCK[place], ALBERDING               ACCOUNT NO.:  [id number]  MEDICAL RECORD NO.:  [id number]  LOCATION:  [facility]                         FACILITY:  [facility]  PHYSICIAN:  Astrid Divine. Marcelino Scot, M.D. 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  PROCEDURES: 1. Removal of deep implant, left ankle. 2. Stress fluoroscopy of the syndesmosis.  SURGEON:  Astrid Divine. Marcelino Scot, M.D.  ASSISTANT:  None.  ANESTHESIA:  General.  COMPLICATIONS:  None.  TOURNIQUET:  None.  DISPOSITION:  To PACU.  CONDITION:  Stable.  BRIEF SUMMARY AND INDICATION FOR PROCEDURE:  Jackson Gray is a 70 year old male, status post a trimalleolar fracture that was treated on delayed basis with ORIF, and it involved the repair of the syndesmosis as well as nonunion of the fibula with a TightRope device and syndesmotic screw.  The syndesmosis went on to loosen and migrate slightly though the TightRope device remained intact.  I discussed with him the risks and benefits of removal of the loose screw including the possibility it may be broken at the time of withdrawal, failure to alleviate symptoms, need for further surgery, and multiple others.  The patient acknowledged these risks and strongly wished to proceed.  BRIEF SUMMARY OF PROCEDURE:  The patient was taken to the operating room where general anesthesia was induced.  His left lower extremity was prepped and draped in usual sterile fashion.  I brought in C-arm to identify the correct location for a small stab incision.  The incision was made and elevator used to  reveal the head of the screw.  Because the screw was loose, I was unable to extract it by simply withdrawing it and instead had to increase the incision and get a clamp around the head of the screw and then withdraw it.  After it was withdrawn, C-arm  was used once more to perform a stress fluoro evaluation and no gapping or motion was identified at the syndesmosis nor of the fibula.  There did appear to be some motion at the medial malleolus but without translation of the talus.  This was not symptomatic for the patient.  We did not perform a nonunion repair and this has also not been discussed with the patient as a potential portion of the procedure.  The wound was irrigated and closed in layered fashion with 2-0 Vicryl and 3-0 nylon.  The patient was taken to PACU in stable condition after application of sterile gently compressive dressing.  PROGNOSIS:  Mr. Davia will be weightbearing as tolerated with no restrictions.  I will plan to remove his sutures in 2 weeks.  He will require no formal DVT prophylaxis because I anticipate early ambulation and return to motion.     Astrid Divine. Marcelino Scot, M.D.     MHH/MEDQ  D:  05/12/2016  T:  05/12/2016  Job:  [id number]

## 2016-05-12 NOTE — Discharge Instructions (Signed)
Orthopaedic Trauma Service Discharge Instructions   General Discharge Instructions  WEIGHT BEARING STATUS: Weightbearing as tolerated   RANGE OF MOTION/ACTIVITY: range of motion as tolerated  Wound Care: daily dressing changes starting on 05/15/2016. See instructions below   Discharge Wound Care Instructions  Do NOT apply any ointments, solutions or lotions to pin sites or surgical wounds.  These prevent needed drainage and even though solutions like hydrogen peroxide kill bacteria, they also damage cells lining the pin sites that help fight infection.  Applying lotions or ointments can keep the wounds moist and can cause them to breakdown and open up as well. This can increase the risk for infection. When in doubt call the office.  Surgical incisions should be dressed daily.  If any drainage is noted, use one layer of adaptic, then gauze, Kerlix, and an ace wrap.  Once the incision is completely dry and without drainage, it may be left open to air out.  Showering may begin 36-48 hours later.  Cleaning gently with soap and water.  Traumatic wounds should be dressed daily as well.    One layer of adaptic, gauze, Kerlix, then ace wrap.  The adaptic can be discontinued once the draining has ceased    If you have a wet to dry dressing: wet the gauze with saline the squeeze as much saline out so the gauze is moist (not soaking wet), place moistened gauze over wound, then place a dry gauze over the moist one, followed by Kerlix wrap, then ace wrap.    PAIN MEDICATION USE AND EXPECTATIONS  You have likely been given narcotic medications to help control your pain.  After a traumatic event that results in an fracture (broken bone) with or without surgery, it is ok to use narcotic pain medications to help control one's pain.  We understand that everyone responds to pain differently and each individual patient will be evaluated on a regular basis for the continued need for narcotic medications.  Ideally, narcotic medication use should last no more than 6-8 weeks (coinciding with fracture healing).   As a patient it is your responsibility as well to monitor narcotic medication use and report the amount and frequency you use these medications when you come to your office visit.   We would also advise that if you are using narcotic medications, you should take a dose prior to therapy to maximize you participation.  IF YOU ARE ON NARCOTIC MEDICATIONS IT IS NOT PERMISSIBLE TO OPERATE A MOTOR VEHICLE (MOTORCYCLE/CAR/TRUCK/MOPED) OR HEAVY MACHINERY DO NOT MIX NARCOTICS WITH OTHER CNS (CENTRAL NERVOUS SYSTEM) DEPRESSANTS SUCH AS ALCOHOL  Diet: as you were eating previously.  Can use over the counter stool softeners and bowel preparations, such as Miralax, to help with bowel movements.  Narcotics can be constipating.  Be sure to drink plenty of fluids    STOP SMOKING OR USING NICOTINE PRODUCTS!!!!  As discussed nicotine severely impairs your body's ability to heal surgical and traumatic wounds but also impairs bone healing.  Wounds and bone heal by forming microscopic blood vessels (angiogenesis) and nicotine is a vasoconstrictor (essentially, shrinks blood vessels).  Therefore, if vasoconstriction occurs to these microscopic blood vessels they essentially disappear and are unable to deliver necessary nutrients to the healing tissue.  This is one modifiable factor that you can do to dramatically increase your chances of healing your injury.    (This means no smoking, no nicotine gum, patches, etc)  DO NOT USE NONSTEROIDAL ANTI-INFLAMMATORY DRUGS (NSAID'S)  Using products such  as Advil (ibuprofen), Aleve (naproxen), Motrin (ibuprofen) for additional pain control during fracture healing can delay and/or prevent the healing response.  If you would like to take over the counter (OTC) medication, Tylenol (acetaminophen) is ok.  However, some narcotic medications that are given for pain control contain  acetaminophen as well. Therefore, you should not exceed more than 4000 mg of tylenol in a day if you do not have liver disease.  Also note that there are may OTC medicines, such as cold medicines and allergy medicines that my contain tylenol as well.  If you have any questions about medications and/or interactions please ask your doctor/PA or your pharmacist.      ICE AND ELEVATE INJURED/OPERATIVE EXTREMITY  Using ice and elevating the injured extremity above your heart can help with swelling and pain control.  Icing in a pulsatile fashion, such as 20 minutes on and 20 minutes off, can be followed.    Do not place ice directly on skin. Make sure there is a barrier between to skin and the ice pack.    Using frozen items such as frozen peas works well as the conform nicely to the are that needs to be iced.  USE AN ACE WRAP OR TED HOSE FOR SWELLING CONTROL  In addition to icing and elevation, Ace wraps or TED hose are used to help limit and resolve swelling.  It is recommended to use Ace wraps or TED hose until you are informed to stop.    When using Ace Wraps start the wrapping distally (farthest away from the body) and wrap proximally (closer to the body)   Example: If you had surgery on your leg or thing and you do not have a splint on, start the ace wrap at the toes and work your way up to the thigh        If you had surgery on your upper extremity and do not have a splint on, start the ace wrap at your fingers and work your way up to the upper arm  IF YOU ARE IN A SPLINT OR CAST DO NOT [place]   If your splint gets wet for any reason please contact the office immediately. You may shower in your splint or cast as long as you keep it dry.  This can be done by wrapping in a cast cover or garbage back (or similar)  Do Not stick any thing down your splint or cast such as pencils, money, or hangers to try and scratch yourself with.  If you feel itchy take benadryl as prescribed on the  bottle for itching  IF YOU ARE IN A CAM BOOT (BLACK BOOT)  You may remove boot periodically. Perform daily dressing changes as noted below.  Wash the liner of the boot regularly and wear a sock when wearing the boot. It is recommended that you sleep in the boot until told otherwise  CALL THE OFFICE WITH ANY QUESTIONS OR CONCERNS: [phone]       Discharge Wound Care Instructions  Do NOT apply any ointments, solutions or lotions to pin sites or surgical wounds.  These prevent needed drainage and even though solutions like hydrogen peroxide kill bacteria, they also damage cells lining the pin sites that help fight infection.  Applying lotions or ointments can keep the wounds moist and can cause them to breakdown and open up as well. This can increase the risk for infection. When in doubt call the office.  Surgical incisions should  be dressed daily.  If any drainage is noted, use one layer of adaptic, then gauze, Kerlix, and an ace wrap.  Once the incision is completely dry and without drainage, it may be left open to air out.  Showering may begin 36-48 hours later.  Cleaning gently with soap and water.  Traumatic wounds should be dressed daily as well.    One layer of adaptic, gauze, Kerlix, then ace wrap.  The adaptic can be discontinued once the draining has ceased    If you have a wet to dry dressing: wet the gauze with saline the squeeze as much saline out so the gauze is moist (not soaking wet), place moistened gauze over wound, then place a dry gauze over the moist one, followed by Kerlix wrap, then ace wrap.

## 2016-05-13 ENCOUNTER — Encounter (HOSPITAL_COMMUNITY): Payer: Self-pay | Admitting: Orthopedic Surgery

## 2016-05-15 NOTE — Anesthesia Postprocedure Evaluation (Signed)
Anesthesia Post Note  Patient: Jackson Gray  Procedure(s) Performed: Procedure(s) (LRB): HARDWARE REMOVAL LEFT ANKLE (Left)  Patient location during evaluation: PACU Anesthesia Type: General Level of consciousness: sedated and patient cooperative Pain management: pain level controlled Vital Signs Assessment: post-procedure vital signs reviewed and stable Respiratory status: spontaneous breathing Cardiovascular status: stable Anesthetic complications: no    Last Vitals:  Vitals:   05/12/16 0930 05/12/16 0945  BP: 131/84 (!) 148/93  Pulse: 66 69  Resp: 15 16  Temp: 36.4 C     Last Pain:  Vitals:   05/13/16 1230  TempSrc:   PainSc: 0-No pain                 Nolon Nations

## 2017-01-23 IMAGING — RF DG ANKLE 2V *L*
1 series · 1 of 1 positions shown · non-contrast
Comparison: None.

CLINICAL DATA: Hardware removal

EXAM:
DG C-ARM 61-120 MIN; LEFT ANKLE - 2 VIEW

[Series 1: run · 1 of 1 slices shown]
[im 1/1]
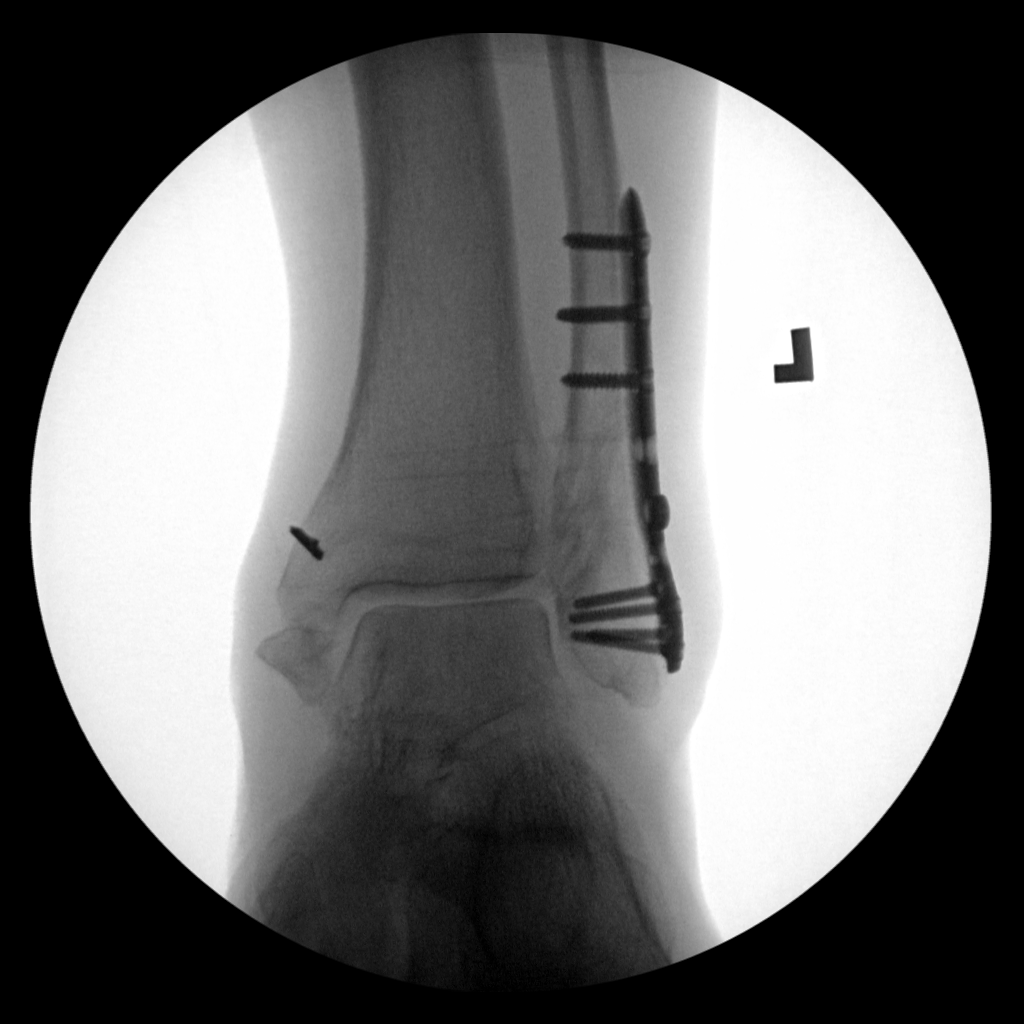

[1 of 1 positions shown; findings below may reference images not displayed]

FLUOROSCOPY TIME:  Radiation Exposure Index (as provided by the
fluoroscopic device): Un known

If the device does not provide the exposure index:

Fluoroscopy Time:  15 seconds

Number of Acquired Images:  1
FINDINGS: There is again noted hardware in the distal fibula and extending
into the tibia. There has been interval removal of 1 of the fixation
screws traversing the fibula and tibia. No gross soft tissue
abnormality is noted.
IMPRESSION: Intraoperative guidance for screw removal.
